# Patient Record
Sex: Male | Born: 1975 | Race: White | Hispanic: No | Marital: Married | Smoking: Never smoker
Health system: Southern US, Community
[De-identification: ages and names within clinical notes are randomized; demographics above are authoritative.]

## PROBLEM LIST (undated history)

## (undated) DIAGNOSIS — M25569 Pain in unspecified knee: Secondary | ICD-10-CM

## (undated) HISTORY — DX: Pain in unspecified knee: M25.569

## (undated) HISTORY — PX: TONSILLECTOMY: SUR1361

## (undated) HISTORY — PX: WISDOM TOOTH EXTRACTION: SHX21

---

## 2004-11-21 ENCOUNTER — Ambulatory Visit: Payer: Self-pay | Admitting: Family Medicine

## 2004-12-26 ENCOUNTER — Ambulatory Visit: Payer: Self-pay | Admitting: Family Medicine

## 2005-09-26 ENCOUNTER — Ambulatory Visit: Payer: Self-pay | Admitting: Family Medicine

## 2005-10-29 ENCOUNTER — Ambulatory Visit: Payer: Self-pay | Admitting: Family Medicine

## 2007-04-09 HISTORY — PX: OTHER SURGICAL HISTORY: SHX169

## 2007-04-15 ENCOUNTER — Ambulatory Visit: Payer: Self-pay | Admitting: Family Medicine

## 2007-04-29 ENCOUNTER — Ambulatory Visit: Payer: Self-pay | Admitting: Family Medicine

## 2007-05-05 ENCOUNTER — Ambulatory Visit: Payer: Self-pay | Admitting: Family Medicine

## 2007-05-06 ENCOUNTER — Encounter (INDEPENDENT_AMBULATORY_CARE_PROVIDER_SITE_OTHER): Payer: Self-pay | Admitting: Internal Medicine

## 2007-05-13 LAB — CONVERTED CEMR LAB
ALT: 25 units/L (ref 0–53)
AST: 17 units/L (ref 0–37)
Alkaline Phosphatase: 63 units/L (ref 39–117)
BUN: 16 mg/dL (ref 6–23)
Basophils Relative: 1 % (ref 0–1)
Bilirubin, Direct: 0.2 mg/dL (ref 0.0–0.3)
Calcium: 9.3 mg/dL (ref 8.4–10.5)
Cholesterol: 175 mg/dL (ref 0–200)
Eosinophils Absolute: 0.1 10*3/uL (ref 0.0–0.7)
Eosinophils Relative: 2 % (ref 0–5)
Glucose, Bld: 94 mg/dL (ref 70–99)
HCT: 47.9 % (ref 39.0–52.0)
Indirect Bilirubin: 0.6 mg/dL (ref 0.0–0.9)
Lymphs Abs: 2.1 10*3/uL (ref 0.7–4.0)
MCHC: 33.2 g/dL (ref 30.0–36.0)
MCV: 92.5 fL (ref 78.0–100.0)
Monocytes Relative: 12 % (ref 3–12)
Neutrophils Relative %: 45 % (ref 43–77)
Platelets: 183 10*3/uL (ref 150–400)
Potassium: 4.9 meq/L (ref 3.5–5.3)
Sodium: 143 meq/L (ref 135–145)
Total Protein: 7.2 g/dL (ref 6.0–8.3)
Triglycerides: 157 mg/dL — ABNORMAL HIGH (ref <150)
VLDL: 31 mg/dL (ref 0–40)

## 2008-02-11 ENCOUNTER — Ambulatory Visit: Payer: Self-pay | Admitting: Family Medicine

## 2008-02-11 DIAGNOSIS — R51 Headache: Secondary | ICD-10-CM | POA: Insufficient documentation

## 2008-02-11 DIAGNOSIS — R519 Headache, unspecified: Secondary | ICD-10-CM | POA: Insufficient documentation

## 2008-12-08 ENCOUNTER — Ambulatory Visit: Payer: Self-pay | Admitting: Family Medicine

## 2008-12-08 DIAGNOSIS — J069 Acute upper respiratory infection, unspecified: Secondary | ICD-10-CM | POA: Insufficient documentation

## 2009-09-05 ENCOUNTER — Ambulatory Visit: Payer: Self-pay | Admitting: Family Medicine

## 2009-09-05 DIAGNOSIS — M25569 Pain in unspecified knee: Secondary | ICD-10-CM | POA: Insufficient documentation

## 2009-09-20 ENCOUNTER — Ambulatory Visit: Payer: Self-pay | Admitting: Family Medicine

## 2010-03-28 NOTE — Assessment & Plan Note (Signed)
Summary: knee pain, acute only/alc   Vital Signs:  Patient profile:   35 year old male Height:      69.75 inches Weight:      201.50 pounds BMI:     29.23 Temp:     98.2 degrees F oral Pulse rate:   76 / minute Pulse rhythm:   regular BP sitting:   122 / 80  (left arm) Cuff size:   regular  Vitals Entered By: Lewanda Rife LPN (September 05, 2009 4:03 PM) CC: Pain in left knee. Now pain scale is 3-4. (Pt has been playing basketball on Mon nights)   History of Present Illness: has been playing BB with some friends once per week  one day after he came home it swelled up like a grapefuit -- even without trauma  went back this week - and now doing the same thing   L knee  pain is dull and widespread ache - no specific area  hurts to bend it all the way -- is tight with swelling also  can straighten it  no problem with lateral movement  some pain after inactivity  stairs generally do not bother it   1/2 marathon coming up in october is a regular runner  running does not bother him at all   also works with a Psychologist, educational at a gym- and no exercises hurt     Allergies (verified): No Known Drug Allergies  Past History:  Past Surgical History: Last updated: 04/29/2007 Lasik both eyes 04/09/07 Tonsillectomy--age 84 wisdom teeth  Family History: Last updated: 04/29/2007 Father: 66--L&W Mother: 69--thyroid Siblings: 3 br--1 has DM, type 2                        2 L&W  DM- 0 MI- 0 CVA- PGM Prostate Cancer- 0 Breast Cancer- 0 Ovarian Cancer- 0 Uterine Cancer- 0 Colon Cancer- 0 Drug/ ETOH Abuse- M cousin -both Depression- 0 lung ca--Muncle and aunt--smokers Parkinsons--Puncle  Social History: Last updated: 09/05/2009 Marital Status: Married Children: 2--5 and 4mo Occupation: owns Teacher, 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 CV:  Denies chest pain or discomfort and palpitations. Resp:  Denies cough. MS:  Complains of joint pain, joint swelling, and stiffness; denies joint redness, cramps, and muscle weakness. Derm:  Denies lesion(s), poor wound healing, and rash. Neuro:  Denies numbness, tingling, and weakness. Heme:  Denies abnormal bruising and bleeding.  Physical Exam  General:  Well-developed,well-nourished,in no acute distress; alert,appropriate and cooperative throughout examination Head:  normocephalic, atraumatic, and no abnormalities observed.   Neck:  No deformities, masses, or tenderness noted. Lungs:  Normal respiratory effort, chest expands symmetrically. Lungs are clear to auscultation, no crackles or wheezes. Heart:  Normal rate and regular rhythm. S1 and S2 normal without gallop, murmur, click, rub or other extra sounds. Msk:  L knee mild superior soft tissue swelling without evidence of effusion no crepitice no bony or joint line tenderness whatsoever  some pain on full flexion neg mcmurray nl drawer/ lachman and  gait   Pulses:  R and L carotid,radial,femoral,dorsalis pedis and posterior tibial pulses are full and equal bilaterally Extremities:  no pedal edema  Neurologic:  strength normal in all extremities, sensation intact to light touch, gait normal, and DTRs symmetrical and normal.   Skin:  Intact without suspicious lesions or rashes Cervical Nodes:  No lymphadenopathy noted Psych:  normal affect, talkative and pleasant    Impression & Recommendations:  Problem # 1:  KNEE PAIN, LEFT  (ICD-719.46) Assessment New  L knee pain and swelling in athelete exacerbated by basketball but not running or weights  pt is training for 1/2 marathon in the fall  fairly nl exam except for some superficial swelling - nl rom with tenderness to full flex suspect overuse injury for 2 wk recommend relative rest / ice/ elevation (no pivoting/ BB)  mobic 15 mg daily for 2 wk then f/u with Dr Patsy Lager for eval  will use soft knee brace as needed for ambulation The following medications were removed from the medication list:    Aleve 220 Mg Tabs (Naproxen sodium) .Marland Kitchen... As needed His updated medication list for this problem includes:    Tylenol Extra Strength 500 Mg Tabs (Acetaminophen) .Marland Kitchen... As needed    Vicodin 5-500 Mg Tabs (Hydrocodone-acetaminophen) .Marland Kitchen... 1 every 4-6hrs as needed pain/headache by mouth    Mobic 15 Mg Tabs (Meloxicam) .Marland Kitchen... 1 by mouth once daily for 14 days - take on a full stomach  Orders: Prescription Created Electronically [phone])  Complete Medication List: 1)  Tylenol Extra Strength 500 Mg Tabs (Acetaminophen) .... As needed 2)  Vicodin 5-500 Mg Tabs (Hydrocodone-acetaminophen) .Marland Kitchen.. 1 every 4-6hrs as needed pain/headache by mouth 3)  Nasonex 50 Mcg/act Susp (Mometasone furoate) .... 2 sprays each nostril once daily for congestion 4)  Delsym 30 Mg/65ml Lqcr (Dextromethorphan polistirex) .... Otc as directed. 5)  Hycodan  .Marland Kitchen.. 1 tsp at bedtime, may repeat in 4-6 hrs as needed cough 6)  Mobic 15 Mg Tabs (Meloxicam) .Marland Kitchen.. 1 by mouth once daily for 14 days - take on a full stomach  Patient Instructions: 1)  elevate your leg and knee  2)  take the mobic with food for 2 weeks 3)  please schedule f/u with Dr Patsy Lager (knee pain in athlete) in about 2 weeks  4)  wear the soft knee brace for running or walking  5)  avoid basketball for 2 weeks until your sports med appt  Prescriptions: MOBIC 15 MG TABS (MELOXICAM) 1 by mouth once daily for 14 days - take on a full stomach  #30  x 0   Entered and Authorized by:   Judith Part MD   Signed by:   Judith Part MD on 09/05/2009   Method used:   Electronically to        [id number] (retail)       [address]       [place], [place]  [address]       Ph: [phone]       Fax: [phone]   RxID:   [id number]   Current Allergies (reviewed today): No known allergies

## 2010-03-28 NOTE — Miscellaneous (Signed)
Summary: Controlled Substances Contract  Controlled Substances Contract   Imported By: Lester Hingham 09/07/2009 11:59:25  _____________________________________________________________________  External Attachment:    Type:   Image     Comment:   External Document

## 2010-03-28 NOTE — Assessment & Plan Note (Signed)
Summary: 30 MIN APPT FOR REFER FRM DR. TOWER FOR KNEE PAIN / LFW   Vital Signs:  Patient profile:   35 year old male Height:      69.75 inches Weight:      200.6 pounds BMI:     29.09 Temp:     98.6 degrees F oral Pulse rate:   76 / minute Pulse rhythm:   regular BP sitting:   90 / 68  (left arm) Cuff size:   regular  Vitals Entered By: Benny Lennert CMA (AAMA) (September 20, 2009 11:00 AM)  History of Present Illness: Chief complaint Left knee pain referral from Dr. Milinda Antis  35 year old with left knee pain:  Has been playing some basketball  Will swell up like a grapefruit. Took three or four weeks off and it blew off -- feeling a lot better now  Beginning training for a marathon right now, up to about 5 miles a day. No mechanical symptom no locking up of his joint no effusion  Mainly hurts when playing basketball.  Has a patellar stability brace.    Allergies (verified): No Known Drug Allergies  Past History:  Past medical, surgical, family and social histories (including risk factors) reviewed, and no changes noted (except as noted below).  Past Medical History: Reviewed history from 09/05/2009 and no changes required. knee pain  Past Surgical History: Reviewed history from 04/29/2007 and no changes required. Lasik both eyes 04/09/07 Tonsillectomy--age 75 wisdom teeth  Family History: Reviewed history from 04/29/2007 and no changes required. Father: 66--L&W Mother: 69--thyroid Siblings: 3 br--1 has DM, type 2                        2 L&W  DM- 0 MI- 0 CVA- PGM Prostate Cancer- 0 Breast Cancer- 0 Ovarian Cancer- 0 Uterine Cancer- 0 Colon Cancer- 0 Drug/ ETOH Abuse- M cousin -both Depression- 0 lung ca--Muncle and aunt--smokers Parkinsons--Puncle  Social History: Reviewed history from 09/05/2009 and no changes required. 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 Otherwise the pertinent positives of the ROS are noted above.    Physical Exam  General:  GEN: Well-developed,well-nourished,in no acute distress; alert,appropriate and cooperative throughout examination HEENT: Normocephalic and atraumatic without obvious abnormalities. No apparent alopecia or balding. Ears, externally no deformities PULM: Breathing comfortably in no respiratory distress EXT: No clubbing, cyanosis, or edema PSYCH: Normally interactive. Cooperative during the interview. Pleasant. Friendly and conversant. Not anxious or depressed appearing. Normal, full affect.  Msk:  Gait: Normal heel toe pattern ROM: WNL Effusion: neg Echymosis or edema: none Patellar tendon NT Painful PLICA: neg Patellar grind: negative Medial and lateral patellar facet loading: negative medial and lateral joint lines:NT Mcmurray's neg Flexion-pinch neg Varus and valgus stress: stable Lachman: neg Ant and Post drawer: neg Hip abduction, IR, ER: WNL Hip flexion str: 5/5 Hip abd: 5/5 Quad: 5/5 VMO atrophy:No Hamstring concentric and eccentric: 5/5    Impression & Recommendations:  Problem # 1:  KNEE PAIN, LEFT (ICD-719.46) The patient's knee seems to have calmed down and I cannot elicit anything that would make me suspicious for internal derangement.  Certainly he could have contused his meniscus while playing basketball, but I am going to have him wear his patellar stability brace when playing and work on some proprioception.  Already doing cross fit.  cc: Dr. Milinda Antis  The following medications were removed from the medication list:    Tylenol Extra Strength 500 Mg Tabs (Acetaminophen) .Marland Kitchen... As needed     Vicodin 5-500 Mg Tabs (Hydrocodone-acetaminophen) .Marland Kitchen... 1 every 4-6hrs as needed pain/headache by mouth    Mobic 15 Mg Tabs (Meloxicam) .Marland Kitchen... 1 by mouth once daily for 14 days - take on a full stomach  Current Allergies (reviewed today): No known allergies

## 2011-08-01 ENCOUNTER — Other Ambulatory Visit: Payer: Self-pay | Admitting: Family Medicine

## 2011-08-01 DIAGNOSIS — Z0000 Encounter for general adult medical examination without abnormal findings: Secondary | ICD-10-CM

## 2011-08-01 DIAGNOSIS — Z136 Encounter for screening for cardiovascular disorders: Secondary | ICD-10-CM

## 2011-08-02 ENCOUNTER — Other Ambulatory Visit (INDEPENDENT_AMBULATORY_CARE_PROVIDER_SITE_OTHER): Payer: BC Managed Care – PPO

## 2011-08-02 DIAGNOSIS — Z136 Encounter for screening for cardiovascular disorders: Secondary | ICD-10-CM

## 2011-08-02 DIAGNOSIS — Z0000 Encounter for general adult medical examination without abnormal findings: Secondary | ICD-10-CM

## 2011-08-02 LAB — LIPID PANEL
Cholesterol: 177 mg/dL (ref 0–200)
HDL: 38.3 mg/dL — ABNORMAL LOW (ref >39.00)
LDL Cholesterol: 113 mg/dL — ABNORMAL HIGH (ref 0–99)
Triglycerides: 131 mg/dL (ref 0.0–149.0)
VLDL: 26.2 mg/dL (ref 0.0–40.0)

## 2011-08-02 LAB — COMPREHENSIVE METABOLIC PANEL
Alkaline Phosphatase: 58 U/L (ref 39–117)
BUN: 16 mg/dL (ref 6–23)
CO2: 29 mEq/L (ref 19–32)
Creatinine, Ser: 1.2 mg/dL (ref 0.4–1.5)
GFR: 70.36 mL/min (ref >60.00)
Glucose, Bld: 81 mg/dL (ref 70–99)
Total Bilirubin: 0.7 mg/dL (ref 0.3–1.2)
Total Protein: 6.9 g/dL (ref 6.0–8.3)

## 2011-08-06 ENCOUNTER — Encounter: Payer: Self-pay | Admitting: Family Medicine

## 2011-08-08 ENCOUNTER — Encounter: Payer: Self-pay | Admitting: *Deleted

## 2011-08-08 ENCOUNTER — Ambulatory Visit (INDEPENDENT_AMBULATORY_CARE_PROVIDER_SITE_OTHER): Payer: BC Managed Care – PPO | Admitting: Family Medicine

## 2011-08-08 ENCOUNTER — Encounter: Payer: Self-pay | Admitting: Family Medicine

## 2011-08-08 VITALS — BP 106/72 | HR 78 | Temp 98.3°F | Wt 210.0 lb

## 2011-08-08 DIAGNOSIS — Z0000 Encounter for general adult medical examination without abnormal findings: Secondary | ICD-10-CM

## 2011-08-08 DIAGNOSIS — R5381 Other malaise: Secondary | ICD-10-CM

## 2011-08-08 DIAGNOSIS — Z23 Encounter for immunization: Secondary | ICD-10-CM

## 2011-08-08 DIAGNOSIS — R5383 Other fatigue: Secondary | ICD-10-CM

## 2011-08-08 NOTE — Progress Notes (Signed)
CPE- See plan.  Routine anticipatory guidance given to patient.  See health maintenance. Colon and prostate cancer screening not indicated.  Tetanus shot due. Flu shot due this fall.   FH thyroid disease.  Mother and grandmother both had thyroid disease.  He is fatigued, sleeping more at night and still is tired the next day.  His weight is up.  He'll also get anxious and have trouble with concentration.  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  [employer] grad, did well in school.   He does drink caffeine. We discussed tapering.  Feeling well o/w.    PMH and SH reviewed  Meds, vitals, and allergies reviewed.   ROS: See HPI.  Otherwise negative.    GEN: nad, alert and oriented HEENT: mucous membranes moist NECK: supple w/o LA, no TMG CV: rrr. PULM: ctab, no inc wob ABD: soft, +bs EXT: no edema SKIN: no acute rash

## 2011-08-08 NOTE — Patient Instructions (Addendum)
I would get a flu shot each fall.   Take care.  Keep exercising, gradually work on cutting out caffeine.  Try to lose weight gradually.  I would chart your calories to get a baseline and then cut 5%.

## 2011-08-09 DIAGNOSIS — Z0000 Encounter for general adult medical examination without abnormal findings: Secondary | ICD-10-CM | POA: Insufficient documentation

## 2011-08-09 DIAGNOSIS — R5383 Other fatigue: Secondary | ICD-10-CM | POA: Insufficient documentation

## 2011-08-09 NOTE — Assessment & Plan Note (Signed)
Routine anticipatory guidance given to patient.  See health maintenance. Colon and prostate cancer screening not indicated.  Tetanus shot done.   Flu shot due this fall.

## 2011-08-09 NOTE — Assessment & Plan Note (Signed)
Check TSH.  Likely exacerbated by caffeine, sleep disruption work load.  Would work on diet and exercise, taper caffeine and see if sx continue.  He agrees.

## 2016-03-02 DIAGNOSIS — Z23 Encounter for immunization: Secondary | ICD-10-CM | POA: Diagnosis not present

## 2016-08-30 DIAGNOSIS — H6121 Impacted cerumen, right ear: Secondary | ICD-10-CM | POA: Diagnosis not present

## 2016-08-30 DIAGNOSIS — H93291 Other abnormal auditory perceptions, right ear: Secondary | ICD-10-CM | POA: Diagnosis not present

## 2016-12-02 ENCOUNTER — Encounter: Payer: Self-pay | Admitting: Family Medicine

## 2016-12-02 ENCOUNTER — Ambulatory Visit (INDEPENDENT_AMBULATORY_CARE_PROVIDER_SITE_OTHER): Payer: BLUE CROSS/BLUE SHIELD | Admitting: Family Medicine

## 2016-12-02 VITALS — BP 116/70 | HR 75 | Temp 97.4°F | Ht 70.5 in | Wt 194.0 lb

## 2016-12-02 DIAGNOSIS — Z23 Encounter for immunization: Secondary | ICD-10-CM | POA: Diagnosis not present

## 2016-12-02 DIAGNOSIS — Z6827 Body mass index (BMI) 27.0-27.9, adult: Secondary | ICD-10-CM | POA: Diagnosis not present

## 2016-12-02 DIAGNOSIS — R0789 Other chest pain: Secondary | ICD-10-CM

## 2016-12-02 DIAGNOSIS — Z0000 Encounter for general adult medical examination without abnormal findings: Secondary | ICD-10-CM

## 2016-12-02 DIAGNOSIS — Z7689 Persons encountering health services in other specified circumstances: Secondary | ICD-10-CM | POA: Diagnosis not present

## 2016-12-02 LAB — COMPREHENSIVE METABOLIC PANEL
ALBUMIN: 4.3 g/dL (ref 3.5–5.2)
ALT: 20 U/L (ref 0–53)
AST: 14 U/L (ref 0–37)
Alkaline Phosphatase: 60 U/L (ref 39–117)
BILIRUBIN TOTAL: 0.7 mg/dL (ref 0.2–1.2)
BUN: 16 mg/dL (ref 6–23)
CALCIUM: 9.6 mg/dL (ref 8.4–10.5)
CO2: 30 mEq/L (ref 19–32)
CREATININE: 1.17 mg/dL (ref 0.40–1.50)
Chloride: 108 mEq/L (ref 96–112)
GFR: 73.12 mL/min (ref >60.00)
Glucose, Bld: 98 mg/dL (ref 70–99)
Potassium: 5.1 mEq/L (ref 3.5–5.1)
Sodium: 143 mEq/L (ref 135–145)
Total Protein: 7.1 g/dL (ref 6.0–8.3)

## 2016-12-02 LAB — CBC
HCT: 50.4 % (ref 39.0–52.0)
Hemoglobin: 17 g/dL (ref 13.0–17.0)
MCHC: 33.7 g/dL (ref 30.0–36.0)
MCV: 96.9 fl (ref 78.0–100.0)
PLATELETS: 206 10*3/uL (ref 150.0–400.0)
RBC: 5.2 Mil/uL (ref 4.22–5.81)
RDW: 13 % (ref 11.5–15.5)
WBC: 6.3 10*3/uL (ref 4.0–10.5)

## 2016-12-02 LAB — LIPID PANEL
CHOL/HDL RATIO: 4
CHOLESTEROL: 181 mg/dL (ref 0–200)
HDL: 46.5 mg/dL (ref >39.00)
LDL Cholesterol: 110 mg/dL — ABNORMAL HIGH (ref 0–99)
NonHDL: 134.61
TRIGLYCERIDES: 124 mg/dL (ref 0.0–149.0)
VLDL: 24.8 mg/dL (ref 0.0–40.0)

## 2016-12-02 NOTE — Patient Instructions (Addendum)
For chest burning, try Mylanta, Mylicon or Tums. See if pattern with foods- avoid triggers    Keeping you healthy  Get these tests  Blood pressure- Have your blood pressure checked once a year by your healthcare provider.  Normal blood pressure is 120/80.  Weight- Have your body mass index (BMI) calculated to screen for obesity.  BMI is a measure of body fat based on height and weight. You can also calculate your own BMI at https://www.west-esparza.com/.  Cholesterol- Have your cholesterol checked regularly starting at age 63, sooner may be necessary if you have diabetes, high blood pressure, if a family member developed heart diseases at an early age or if you smoke.   Chlamydia, HIV, and other sexual transmitted disease- Get screened each year until the age of 70 then within three months of each new sexual partner.  Diabetes- Have your blood sugar checked regularly if you have high blood pressure, high cholesterol, a family history of diabetes or if you are overweight.  Get these vaccines  Flu shot- Every fall.  Tetanus shot- Every 10 years.  Menactra- Single dose; prevents meningitis.  Take these steps  Don't smoke- If you do smoke, ask your healthcare provider about quitting. For tips on how to quit, go to www.smokefree.gov or call 1-800-QUIT-NOW.  Be physically active- Exercise 5 days a week for at least 30 minutes.  If you are not already physically active start slow and gradually work up to 30 minutes of moderate physical activity.  Examples of moderate activity include walking briskly, mowing the yard, dancing, swimming bicycling, etc.  Eat a healthy diet- Eat a variety of healthy foods such as fruits, vegetables, low fat milk, low fat cheese, yogurt, lean meats, poultry, fish, beans, tofu, etc.  For more information on healthy eating, go to www.thenutritionsource.org  Drink alcohol in moderation- Limit alcohol intake two drinks or less a day.  Never drink and drive.  Dentist-  Brush and floss teeth twice daily; visit your dentis twice a year.  Depression-Your emotional health is as important as your physical health.  If you're feeling down, losing interest in things you normally enjoy please talk with your healthcare provider.  Gun Safety- If you keep a gun in your home, keep it unloaded and with the safety lock on.  Bullets should be stored separately.  Helmet use- Always wear a helmet when riding a motorcycle, bicycle, rollerblading or skateboarding.  Safe sex- If you may be exposed to a sexually transmitted infection, use a condom  Seat belts- Seat bels can save your life; always wear one.  Smoke/Carbon Monoxide detectors- These detectors need to be installed on the appropriate level of your home.  Replace batteries at least once a year.  Skin Cancer- When out in the sun, cover up and use sunscreen SPF 15 or higher.  Violence- If anyone is threatening or hurting you, please tell your healthcare provider.

## 2016-12-02 NOTE — Progress Notes (Signed)
Subjective:    Patient ID: Luis Hill, male    DOB: 01/20/1976, 41 y.o.   MRN: [id number]  HPI This is a 41 yo male who presents today to establish care. He is an Advertising account planner. Not too stressful. He is a widow with a 53 yo daughter and son who is 10. Kids doing ok. Gets regular exercise, runs 3-4 times a week. He is dating and things are going well.   His wife died Mar 18, 2022 of cervical cancer. His brother died 2022/10/17 at age 102 of what may have been a massive heart attack. He was overweight, but no known diabetes, HTN, non smoker- did not get regular medical care.  Parents healthy. Live nearby.   Has been eating better and increased exercise, lost 20 pounds in last 8 months. Gets regular dental and eye care.   Past Medical History:  Diagnosis Date  . Knee pain    Past Surgical History:  Procedure Laterality Date  . Lasik both eyes  04/09/07  . TONSILLECTOMY  Age 32  . WISDOM TOOTH EXTRACTION     Family History  Problem Relation Age of Onset  . Thyroid disease Mother   . Arthritis Mother   . Diabetes Brother        Type II  . Heart disease Brother   . Cancer Maternal Aunt        Lung (smoker)  . Cancer Maternal Uncle        Lung (smoker)  . Parkinsonism Paternal Uncle   . Stroke Paternal Grandmother   . Alcohol abuse Cousin   . Drug abuse Cousin   . Depression Neg Hx   . Colon cancer Neg Hx   . Prostate cancer Neg Hx    Social History  Substance Use Topics  . Smoking status: Never Smoker  . Smokeless tobacco: Never Used  . Alcohol use Yes     Comment: occ      Review of Systems  Constitutional: Negative for fatigue, fever and unexpected weight change.  HENT: Negative.   Eyes: Negative.   Respiratory: Negative for chest tightness and shortness of breath.   Cardiovascular: Positive for chest pain (Has intermittent burning, can last for several hours, no radiation up neck/down arms, no nausea/vomiting/diaphoresis, no SOB. Never with running. ). Negative for  palpitations and leg swelling.  Gastrointestinal: Negative.   Genitourinary: Negative.   Musculoskeletal:       Some knee pain with running  Skin: Negative.   Allergic/Immunologic: Negative.   Neurological: Negative.   Psychiatric/Behavioral: Negative for dysphoric mood. The patient is not nervous/anxious.        Objective:   Physical Exam Physical Exam  Constitutional: He is oriented to person, place, and time. He appears well-developed and well-nourished.  HENT:  Head: Normocephalic and atraumatic.  Right Ear: External ear normal.  Left Ear: External ear normal.  Nose: Nose normal.  Mouth/Throat: Oropharynx is clear and moist.  Eyes: Conjunctivae are normal. Pupils are equal, round, and reactive to light.  Neck: Normal range of motion. Neck supple.  Cardiovascular: Normal rate, regular rhythm, normal heart sounds and intact distal pulses.   Pulmonary/Chest: Effort normal and breath sounds normal.  Abdominal: Soft. Bowel sounds are normal. Hernia confirmed negative in the right inguinal area and confirmed negative in the left inguinal area.  Genitourinary: Testes normal and penis normal. Circumcised.  Musculoskeletal: Normal range of motion. He exhibits no edema or tenderness.       Cervical back: Normal.  Thoracic back: Normal.       Lumbar back: Normal.  Lymphadenopathy:    He has no cervical adenopathy.       Right: No inguinal adenopathy present.       Left: No inguinal adenopathy present.  Neurological: He is alert and oriented to person, place, and time. He has normal reflexes.  Skin: Skin is warm and dry.  Psychiatric: He has a normal mood and affect. His behavior is normal. Judgment normal.  Vitals reviewed.   BP 116/70 (BP Location: Right Arm, Patient Position: Sitting, Cuff Size: Normal)   Pulse 75   Temp (!) 97.4 F (36.3 C) (Oral)   Ht 5' 10.5" (1.791 m)   Wt 194 lb (88 kg)   SpO2 97%   BMI 27.44 kg/m  Depression screen [facility] 2/9 12/02/2016    Decreased Interest 0  Down, Depressed, Hopeless 0  PHQ - 2 Score 0   EKG- SR 78     Assessment & Plan:  1. Annual physical exam - Discussed and encouraged healthy lifestyle choices- adequate sleep, regular exercise, stress management and healthy food choices.   2. Other chest pain - quality and pattern consistent with reflux/acid indigestion, no symptoms with running, discussed warning symptoms for angina- ER - EKG 12-Lead - CBC - Comprehensive metabolic panel - Lipid panel  3. Encounter to establish care  4. BMI 27.0-27.9,adult - Lipid panel  5. Flu vaccine need - Flu Vaccine QUAD 36+ mos IM  - follow up in 1 year, sooner if needed  Olean Ree, FNP-BC  [facility], [facility]  12/02/2016 10:04 AM

## 2016-12-04 ENCOUNTER — Telehealth: Payer: Self-pay | Admitting: Family Medicine

## 2016-12-04 NOTE — Telephone Encounter (Signed)
Patient returned Sierra's call. °

## 2016-12-18 ENCOUNTER — Telehealth: Payer: Self-pay

## 2016-12-18 NOTE — Telephone Encounter (Signed)
Copied from CRM #[id number]. Topic: Quick Communication - Office Called Patient >> Dec 18, 2016  2:35 PM Anice PaganiniMunoz, Rayyan Burley I, [place] wrote: Reason for CRM: pt is return call from office

## 2016-12-18 NOTE — Telephone Encounter (Signed)
Called and spoke with pt, pt aware of results.

## 2016-12-18 NOTE — Telephone Encounter (Signed)
Left message for pt to return call to office. Regarding lab results.

## 2017-07-11 ENCOUNTER — Ambulatory Visit: Payer: BLUE CROSS/BLUE SHIELD | Admitting: Family Medicine

## 2017-07-11 ENCOUNTER — Encounter: Payer: Self-pay | Admitting: Family Medicine

## 2017-07-11 VITALS — BP 126/69 | HR 63 | Temp 98.0°F | Ht 70.5 in | Wt 198.0 lb

## 2017-07-11 DIAGNOSIS — M7711 Lateral epicondylitis, right elbow: Secondary | ICD-10-CM | POA: Diagnosis not present

## 2017-07-11 MED ORDER — MELOXICAM 15 MG PO TABS: ORAL_TABLET | ORAL | 0 refills | Status: DC

## 2017-07-11 NOTE — Progress Notes (Signed)
Bow pain

## 2017-07-11 NOTE — Progress Notes (Signed)
   Subjective:    Patient ID: Luis Hill, male    DOB: September 07, 1975, 42 y.o.   MRN: [id number]  HPI This is a 42 yo male who presents today with right elbow pain x 2 weeks. Started after shoveling mulch. Has taken 2-3 ibuprofen with little relief. Sometimes throbbing pain and shooting pain. Tried an over the counter strap without relief.    Past Medical History:  Diagnosis Date  . Knee pain    Past Surgical History:  Procedure Laterality Date  . Lasik both eyes  04/09/07  . TONSILLECTOMY  Age 57  . WISDOM TOOTH EXTRACTION     Family History  Problem Relation Age of Onset  . Thyroid disease Mother   . Arthritis Mother   . Diabetes Brother        Type II  . Heart disease Brother   . Cancer Maternal Aunt        Lung (smoker)  . Cancer Maternal Uncle        Lung (smoker)  . Parkinsonism Paternal Uncle   . Stroke Paternal Grandmother   . Alcohol abuse Cousin   . Drug abuse Cousin   . Depression Neg Hx   . Colon cancer Neg Hx   . Prostate cancer Neg Hx    Social History   Tobacco Use  . Smoking status: Never Smoker  . Smokeless tobacco: Never Used  Substance Use Topics  . Alcohol use: Yes    Comment: occ  . Drug use: No      Review of Systems Per HPI    Objective:   Physical Exam  Constitutional: He is oriented to person, place, and time. He appears well-developed and well-nourished.  HENT:  Head: Normocephalic and atraumatic.  Neck: Normal range of motion. Neck supple.  Cardiovascular: Normal rate.  Pulmonary/Chest: Effort normal.  Musculoskeletal:       Right elbow: He exhibits normal range of motion, no swelling, no effusion and no deformity. Tenderness found. Lateral epicondyle tenderness noted.  Neurological: He is alert and oriented to person, place, and time.  Skin: Skin is warm and dry.  Psychiatric: He has a normal mood and affect. His behavior is normal. Judgment and thought content normal.  Vitals reviewed.        BP 126/69 (BP Location:  Right Arm, Patient Position: Sitting, Cuff Size: Normal)   Pulse 63   Temp 98 F (36.7 C) (Oral)   Ht 5' 10.5" (1.791 m)   Wt 198 lb (89.8 kg)   SpO2 97%   BMI 28.01 kg/m  Wt Readings from Last 3 Encounters:  07/11/17 198 lb (89.8 kg)  12/02/16 194 lb (88 kg)  08/08/11 210 lb (95.3 kg)    Assessment & Plan:  1. Lateral epicondylitis of right elbow - Provided written and verbal information regarding diagnosis and treatment. - suggested slip on sleeve type brace instead of strap - ice/heat for comfort - meloxicam (MOBIC) 15 MG tablet; Take daily for 7-10 days then daily as needed  Dispense: 30 tablet; Refill: 0 - if no improvement, follow up with Dr. Patsy Lager (sports med)  Olean Ree, FNP-BC  [facility], [facility]  07/15/2017 8:15 PM

## 2017-07-11 NOTE — Patient Instructions (Signed)
Try slip on brace, heat/ice as needed   Tennis Elbow Tennis elbow (lateral epicondylitis) is inflammation of the outer tendons of your forearm close to your elbow. Your tendons attach your muscles to your bones. The outer tendons of your forearm are used to extend your wrist, and they attach on the outside part of your elbow. Tennis elbow is often found in people who play tennis, but anyone may get the condition from repeatedly extending the wrist or turning the forearm. What are the causes? This condition is caused by repeatedly extending your wrist and using your hands. It can result from sports or work that requires repetitive forearm movements. Tennis elbow may also be caused by an injury. What increases the risk? You have a higher risk of developing tennis elbow if you play tennis or another racquet sport. You also have a higher risk if you frequently use your hands for work. This condition is also more likely to develop in:  Musicians.  Carpenters, painters, and plumbers.  Cooks.  Cashiers.  People who work in [employer].  Holiday representative workers.  Butchers.  People who use computers.  What are the signs or symptoms? Symptoms of this condition include:  Pain and tenderness in your forearm and the outer part of your elbow. You may only feel the pain when you use your arm, or you may feel it even when you are not using your arm.  A burning feeling that runs from your elbow through your arm.  Weak grip in your hands.  How is this diagnosed? This condition may be diagnosed by medical history and physical exam. You may also have other tests, including:  X-rays.  MRI.  How is this treated? Your health care provider will recommend lifestyle adjustments, such as resting and icing your arm. Treatment may also include:  Medicines for inflammation. This may include shots of cortisone if your pain continues.  Physical therapy. This may include massage or exercises.  An elbow  brace.  Surgery may eventually be recommended if your pain does not go away with treatment. Follow these instructions at home: Activity  Rest your elbow and wrist as directed by your health care provider. Try to avoid any activities that caused the problem until your health care provider says that you can do them again.  If a physical therapist teaches you exercises, do all of them as directed.  If you lift an object, lift it with your palm facing upward. This lowers the stress on your elbow. Lifestyle  If your tennis elbow is caused by sports, check your equipment and make sure that: ? You are using it correctly. ? It is the best fit for you.  If your tennis elbow is caused by work, take breaks frequently, if you are able. Talk with your manager about how to best perform tasks in a way that is safe. ? If your tennis elbow is caused by computer use, talk with your manager about any changes that can be made to your work environment. General instructions  If directed, apply ice to the painful area: ? Put ice in a plastic bag. ? Place a towel between your skin and the bag. ? Leave the ice on for 20 minutes, 2-3 times per day.  Take medicines only as directed by your health care provider.  If you were given a brace, wear it as directed by your health care provider.  Keep all follow-up visits as directed by your health care provider. This is important. Contact a  health care provider if:  Your pain does not get better with treatment.  Your pain gets worse.  You have numbness or weakness in your forearm, hand, or fingers. This information is not intended to replace advice given to you by your health care provider. Make sure you discuss any questions you have with your health care provider. Document Released: 02/11/2005 Document Revised: 10/12/2015 Document Reviewed: 02/07/2014 Elsevier Interactive Patient Education  [employer].

## 2017-08-08 ENCOUNTER — Other Ambulatory Visit: Payer: Self-pay | Admitting: Family Medicine

## 2017-08-08 ENCOUNTER — Other Ambulatory Visit: Payer: Self-pay

## 2017-08-08 DIAGNOSIS — M7711 Lateral epicondylitis, right elbow: Secondary | ICD-10-CM

## 2017-08-13 ENCOUNTER — Ambulatory Visit (INDEPENDENT_AMBULATORY_CARE_PROVIDER_SITE_OTHER)
Admission: RE | Admit: 2017-08-13 | Discharge: 2017-08-13 | Disposition: A | Discharge status: Home / Self Care | Payer: BLUE CROSS/BLUE SHIELD | Source: Ambulatory Visit | Attending: Family Medicine | Admitting: Family Medicine

## 2017-08-13 ENCOUNTER — Ambulatory Visit: Payer: BLUE CROSS/BLUE SHIELD | Admitting: Family Medicine

## 2017-08-13 ENCOUNTER — Encounter: Payer: Self-pay | Admitting: Family Medicine

## 2017-08-13 VITALS — BP 108/70 | HR 89 | Temp 98.4°F | Ht 70.5 in | Wt 198.5 lb

## 2017-08-13 DIAGNOSIS — M25521 Pain in right elbow: Secondary | ICD-10-CM | POA: Diagnosis not present

## 2017-08-13 NOTE — Patient Instructions (Signed)
I'll notify you of xray results and we'll discuss treatment options

## 2017-08-13 NOTE — Progress Notes (Signed)
   Subjective:    Patient ID: Luis Hill, male    DOB: Jul 19, 1975, 42 y.o.   MRN: [id number]  HPI This is a 42 yo male who presents today with continued right elbow pain. Was seen one month ago and given meloxicam and has wearing brace without relief. Took meloxicam daily for several weeks without improvement. Continued pain on side of elbow, worse with twisting hand/arm and squeezing.   Past Medical History:  Diagnosis Date  . Knee pain    Past Surgical History:  Procedure Laterality Date  . Lasik both eyes  04/09/07  . TONSILLECTOMY  Age 43  . WISDOM TOOTH EXTRACTION     Family History  Problem Relation Age of Onset  . Thyroid disease Mother   . Arthritis Mother   . Diabetes Brother        Type II  . Heart disease Brother   . Cancer Maternal Aunt        Lung (smoker)  . Cancer Maternal Uncle        Lung (smoker)  . Parkinsonism Paternal Uncle   . Stroke Paternal Grandmother   . Alcohol abuse Cousin   . Drug abuse Cousin   . Depression Neg Hx   . Colon cancer Neg Hx   . Prostate cancer Neg Hx    Social History   Tobacco Use  . Smoking status: Never Smoker  . Smokeless tobacco: Never Used  Substance Use Topics  . Alcohol use: Yes    Comment: occ  . Drug use: No      Review of Systems Per HPI    Objective:   Physical Exam  Constitutional: He is oriented to person, place, and time. He appears well-developed and well-nourished. No distress.  HENT:  Head: Normocephalic and atraumatic.  Eyes: Conjunctivae are normal.  Cardiovascular: Normal rate.  Pulmonary/Chest: Effort normal.  Musculoskeletal: Normal range of motion.       Right elbow: He exhibits normal range of motion, no swelling, no effusion and no deformity. Tenderness found. Lateral epicondyle tenderness noted.  Neurological: He is alert and oriented to person, place, and time.  Skin: Skin is warm and dry. He is not diaphoretic.  Psychiatric: He has a normal mood and affect. His behavior is normal.  Judgment and thought content normal.  Vitals reviewed.     BP 108/70 (BP Location: Left Arm, Patient Position: Sitting, Cuff Size: Normal)   Pulse 89   Temp 98.4 F (36.9 C) (Oral)   Ht 5' 10.5" (1.791 m)   Wt 198 lb 8 oz (90 kg)   SpO2 97%   BMI 28.08 kg/m  Wt Readings from Last 3 Encounters:  08/13/17 198 lb 8 oz (90 kg)  07/11/17 198 lb (89.8 kg)  12/02/16 194 lb (88 kg)       Assessment & Plan:  1. Right elbow pain - continued pain with conservative treatment, will check xray - DG Elbow Complete Right; Future   Olean Reeeborah Viyan Rosamond, FNP-BC  [facility]  08/13/2017 11:53 AM

## 2018-10-21 IMAGING — DX DG ELBOW COMPLETE 3+V*R*
4 series · 4 of 4 positions shown · non-contrast
Comparison: None

CLINICAL DATA: RIGHT elbow pain for 1 month

EXAM:
RIGHT ELBOW - COMPLETE 3+ VIEW

[elbow ap]
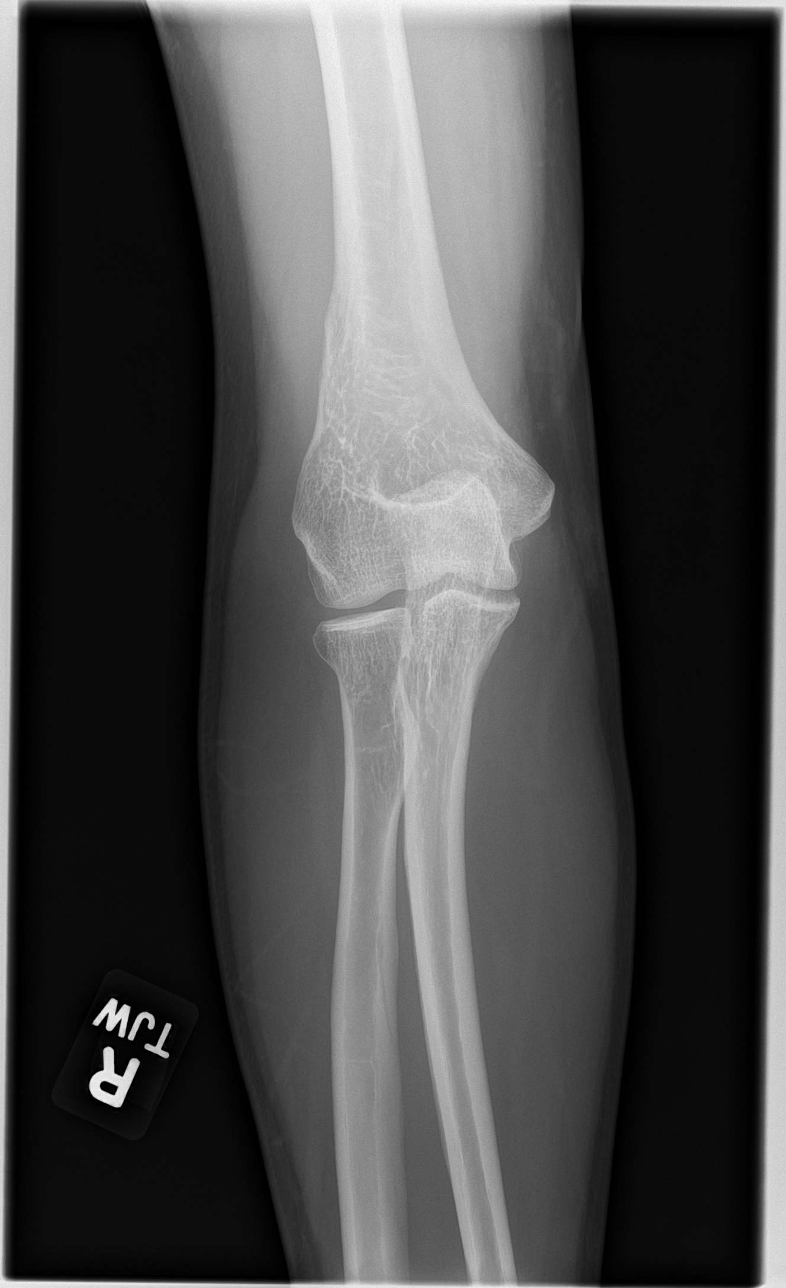

[elbow obl (1 of 2)]
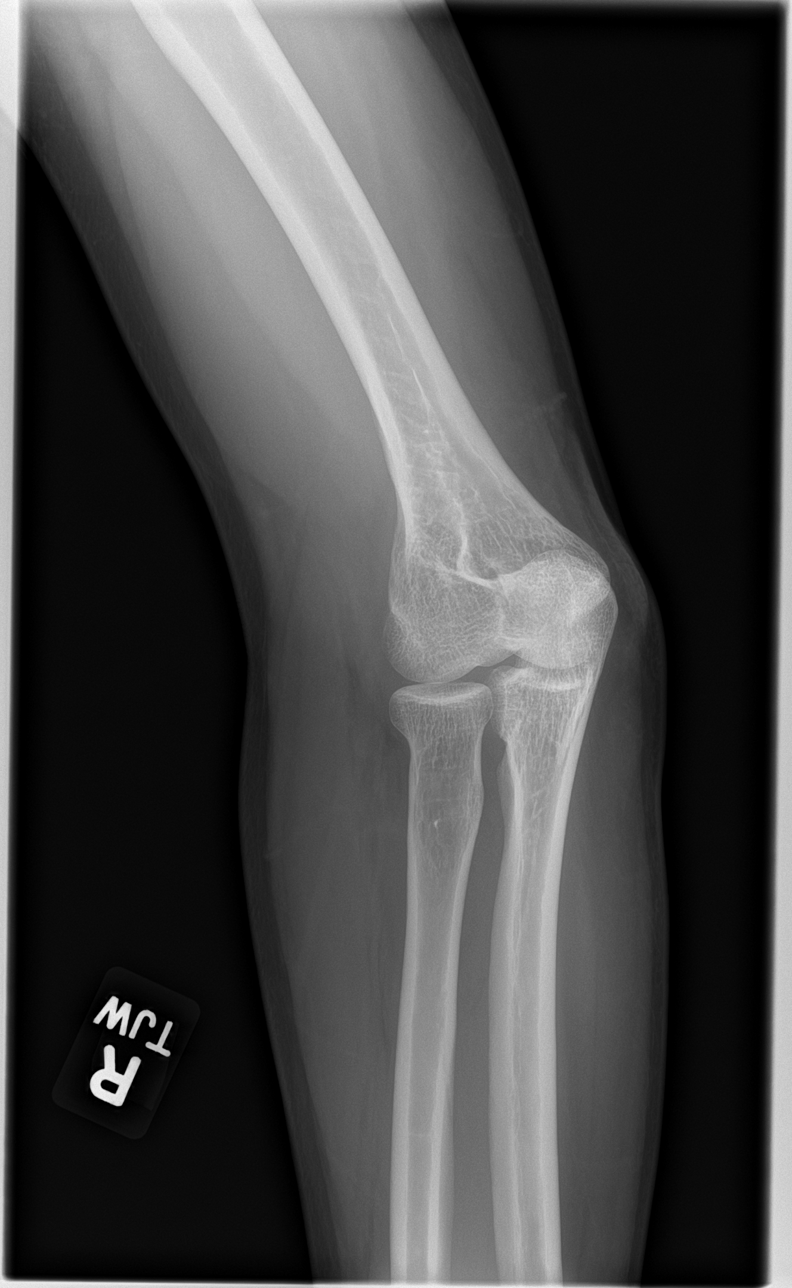

[elbow lat]
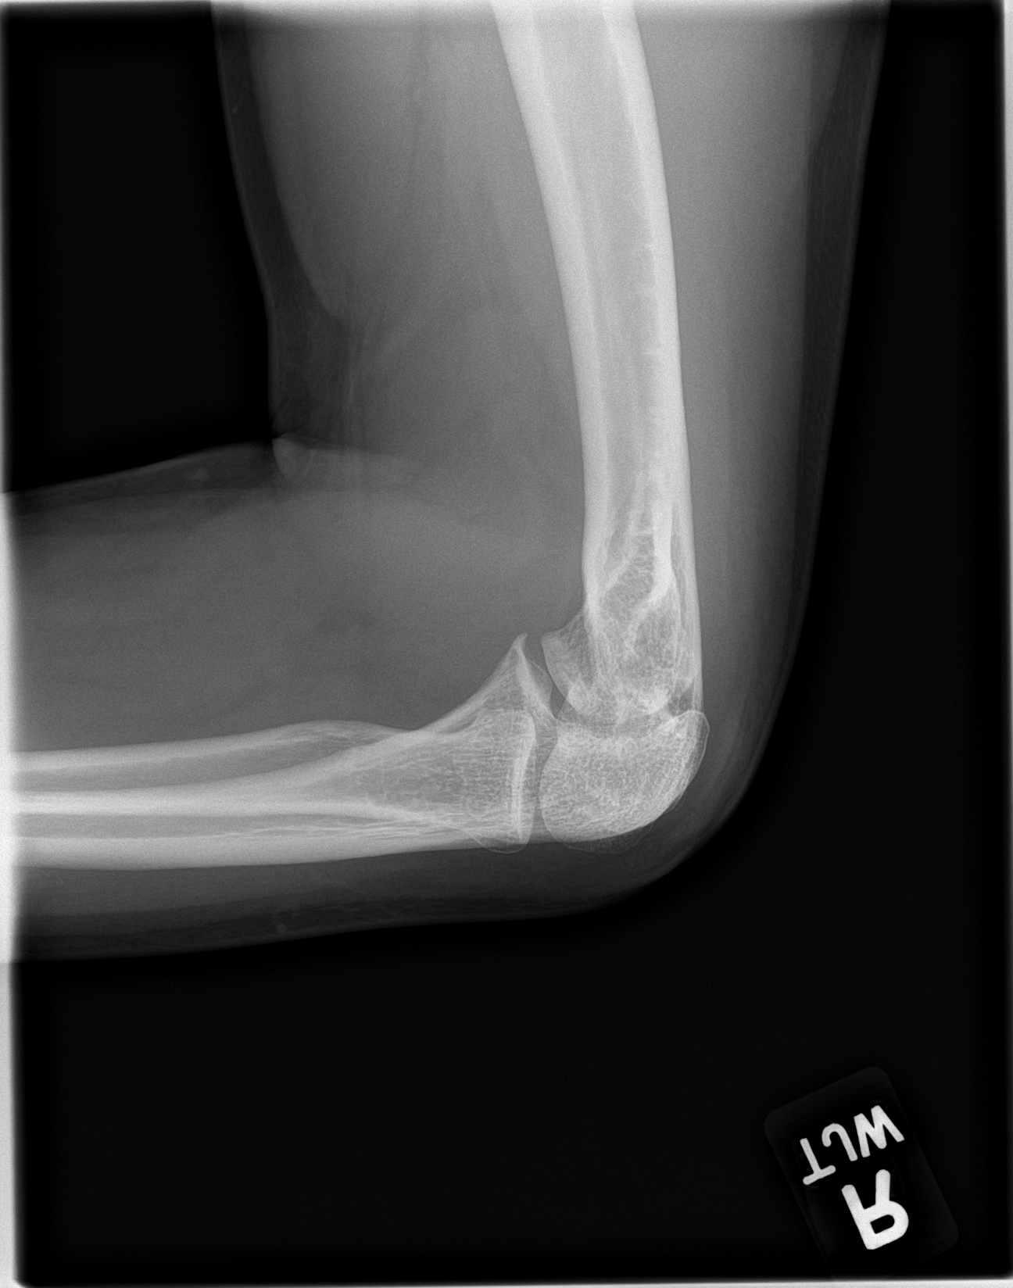

[elbow obl (2 of 2)]
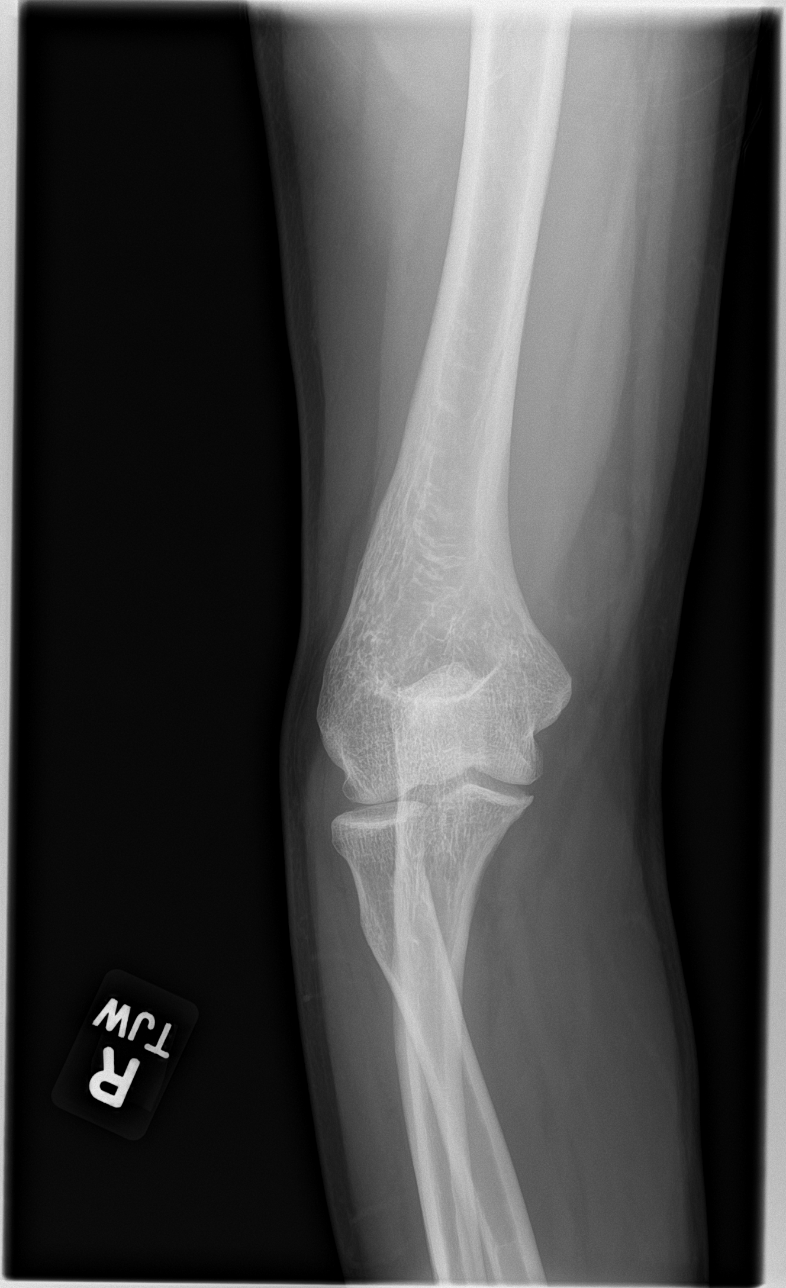

[4 of 4 positions shown; findings below may reference images not displayed]

FINDINGS: Osseous mineralization normal.

Joint spaces preserved.

No fracture, dislocation, or bone destruction.

No joint effusion.
IMPRESSION: Normal exam.

## 2018-11-26 DIAGNOSIS — H52223 Regular astigmatism, bilateral: Secondary | ICD-10-CM | POA: Diagnosis not present

## 2018-12-23 ENCOUNTER — Other Ambulatory Visit (INDEPENDENT_AMBULATORY_CARE_PROVIDER_SITE_OTHER): Payer: BC Managed Care – PPO

## 2018-12-23 ENCOUNTER — Other Ambulatory Visit: Payer: Self-pay

## 2018-12-23 ENCOUNTER — Other Ambulatory Visit: Payer: Self-pay | Admitting: Family Medicine

## 2018-12-23 DIAGNOSIS — E663 Overweight: Secondary | ICD-10-CM

## 2018-12-23 LAB — BASIC METABOLIC PANEL
BUN: 20 mg/dL (ref 6–23)
CO2: 30 mEq/L (ref 19–32)
Calcium: 9.3 mg/dL (ref 8.4–10.5)
Chloride: 105 mEq/L (ref 96–112)
Creatinine, Ser: 1.13 mg/dL (ref 0.40–1.50)
GFR: 70.91 mL/min (ref >60.00)
Glucose, Bld: 87 mg/dL (ref 70–99)
Potassium: 5.1 mEq/L (ref 3.5–5.1)
Sodium: 142 mEq/L (ref 135–145)

## 2018-12-23 LAB — LIPID PANEL
Cholesterol: 219 mg/dL — ABNORMAL HIGH (ref 0–200)
HDL: 47.4 mg/dL (ref >39.00)
NonHDL: 171.64
Total CHOL/HDL Ratio: 5
Triglycerides: 207 mg/dL — ABNORMAL HIGH (ref 0.0–149.0)
VLDL: 41.4 mg/dL — ABNORMAL HIGH (ref 0.0–40.0)

## 2018-12-23 LAB — LDL CHOLESTEROL, DIRECT: Direct LDL: 144 mg/dL

## 2018-12-30 ENCOUNTER — Encounter: Payer: Self-pay | Admitting: Family Medicine

## 2018-12-30 ENCOUNTER — Other Ambulatory Visit: Payer: Self-pay

## 2018-12-30 ENCOUNTER — Ambulatory Visit (INDEPENDENT_AMBULATORY_CARE_PROVIDER_SITE_OTHER): Payer: BC Managed Care – PPO | Admitting: Family Medicine

## 2018-12-30 VITALS — BP 108/78 | HR 67 | Temp 98.0°F | Ht 70.25 in | Wt 200.4 lb

## 2018-12-30 DIAGNOSIS — Z0000 Encounter for general adult medical examination without abnormal findings: Secondary | ICD-10-CM

## 2018-12-30 DIAGNOSIS — R1310 Dysphagia, unspecified: Secondary | ICD-10-CM

## 2018-12-30 NOTE — Patient Instructions (Signed)
Good to see you today  YOu will get a call about an appointment with gastroenterology  Follow up in 1 year  Health Maintenance, Male Adopting a healthy lifestyle and getting preventive care are important in promoting health and wellness. Ask your health care provider about:  The right schedule for you to have regular tests and exams.  Things you can do on your own to prevent diseases and keep yourself healthy. What should I know about diet, weight, and exercise? Eat a healthy diet   Eat a diet that includes plenty of vegetables, fruits, low-fat dairy products, and lean protein.  Do not eat a lot of foods that are high in solid fats, added sugars, or sodium. Maintain a healthy weight Body mass index (BMI) is a measurement that can be used to identify possible weight problems. It estimates body fat based on height and weight. Your health care provider can help determine your BMI and help you achieve or maintain a healthy weight. Get regular exercise Get regular exercise. This is one of the most important things you can do for your health. Most adults should:  Exercise for at least 150 minutes each week. The exercise should increase your heart rate and make you sweat (moderate-intensity exercise).  Do strengthening exercises at least twice a week. This is in addition to the moderate-intensity exercise.  Spend less time sitting. Even light physical activity can be beneficial. Watch cholesterol and blood lipids Have your blood tested for lipids and cholesterol at 42 years of age, then have this test every 5 years. You may need to have your cholesterol levels checked more often if:  Your lipid or cholesterol levels are high.  You are older than 42 years of age.  You are at high risk for heart disease. What should I know about cancer screening? Many types of cancers can be detected early and may often be prevented. Depending on your health history and family history, you may need to  have cancer screening at various ages. This may include screening for:  Colorectal cancer.  Prostate cancer.  Skin cancer.  Lung cancer. What should I know about heart disease, diabetes, and high blood pressure? Blood pressure and heart disease  High blood pressure causes heart disease and increases the risk of stroke. This is more likely to develop in people who have high blood pressure readings, are of African descent, or are overweight.  Talk with your health care provider about your target blood pressure readings.  Have your blood pressure checked: ? Every 3-5 years if you are 31-69 years of age. ? Every year if you are 49 years old or older.  If you are between the ages of 2 and 38 and are a current or former smoker, ask your health care provider if you should have a one-time screening for abdominal aortic aneurysm (AAA). Diabetes Have regular diabetes screenings. This checks your fasting blood sugar level. Have the screening done:  Once every three years after age 18 if you are at a normal weight and have a low risk for diabetes.  More often and at a younger age if you are overweight or have a high risk for diabetes. What should I know about preventing infection? Hepatitis B If you have a higher risk for hepatitis B, you should be screened for this virus. Talk with your health care provider to find out if you are at risk for hepatitis B infection. Hepatitis C Blood testing is recommended for:  Everyone born from  1944 through 29.  Anyone with known risk factors for hepatitis C. Sexually transmitted infections (STIs)  You should be screened each year for STIs, including gonorrhea and chlamydia, if: ? You are sexually active and are younger than 42 years of age. ? You are older than 42 years of age and your health care provider tells you that you are at risk for this type of infection. ? Your sexual activity has changed since you were last screened, and you are at  increased risk for chlamydia or gonorrhea. Ask your health care provider if you are at risk.  Ask your health care provider about whether you are at high risk for HIV. Your health care provider may recommend a prescription medicine to help prevent HIV infection. If you choose to take medicine to prevent HIV, you should first get tested for HIV. You should then be tested every 3 months for as long as you are taking the medicine. Follow these instructions at home: Lifestyle  Do not use any products that contain nicotine or tobacco, such as cigarettes, e-cigarettes, and chewing tobacco. If you need help quitting, ask your health care provider.  Do not use street drugs.  Do not share needles.  Ask your health care provider for help if you need support or information about quitting drugs. Alcohol use  Do not drink alcohol if your health care provider tells you not to drink.  If you drink alcohol: ? Limit how much you have to 0-2 drinks a day. ? Be aware of how much alcohol is in your drink. In the [place] one drink equals one 12 oz bottle of beer (355 mL), one 5 oz glass of wine (148 mL), or one 1 oz glass of hard liquor (44 mL). General instructions  Schedule regular health, dental, and eye exams.  Stay current with your vaccines.  Tell your health care provider if: ? You often feel depressed. ? You have ever been abused or do not feel safe at home. Summary  Adopting a healthy lifestyle and getting preventive care are important in promoting health and wellness.  Follow your health care provider's instructions about healthy diet, exercising, and getting tested or screened for diseases.  Follow your health care provider's instructions on monitoring your cholesterol and blood pressure. This information is not intended to replace advice given to you by your health care provider. Make sure you discuss any questions you have with your health care provider. Document Released: 08/10/2007  Document Revised: 02/04/2018 Document Reviewed: 02/04/2018 Elsevier Patient Education  2020 [employer].

## 2018-12-30 NOTE — Progress Notes (Signed)
Subjective:    Patient ID: Luis Hill, male    DOB: 1975/09/02, 43 y.o.   MRN: [id number]  HPI This is a 43 yo male who presents today for routine CPE.   Last CPE- 11/2016 Tdap- 08/08/2011 Flu- today Dental- regular Eye- regular Exercise- runs Diet- has cut out soda, rarely eats out  Difficulty swallowing certain foods. For about 6 months. Worse with chicken and other meats. No pain with swallowing. Occurs about once a month. No burning, acid reflux or chest pain. Food eventually passes.     Past Medical History:  Diagnosis Date  . Knee pain    Past Surgical History:  Procedure Laterality Date  . Lasik both eyes  04/09/07  . TONSILLECTOMY  Age 77  . WISDOM TOOTH EXTRACTION     Family History  Problem Relation Age of Onset  . Thyroid disease Mother   . Arthritis Mother   . Diabetes Brother        Type II  . Heart disease Brother   . Cancer Maternal Aunt        Lung (smoker)  . Cancer Maternal Uncle        Lung (smoker)  . Parkinsonism Paternal Uncle   . Stroke Paternal Grandmother   . Alcohol abuse Cousin   . Drug abuse Cousin   . Depression Neg Hx   . Colon cancer Neg Hx   . Prostate cancer Neg Hx    Social History   Tobacco Use  . Smoking status: Never Smoker  . Smokeless tobacco: Never Used  Substance Use Topics  . Alcohol use: Yes    Comment: occ  . Drug use: No     Review of Systems  Constitutional: Negative.   HENT: Negative.   Eyes: Negative.   Respiratory: Negative.   Cardiovascular: Negative.   Gastrointestinal: Negative for abdominal pain, anal bleeding, blood in stool, constipation, diarrhea, nausea and vomiting.       Difficulty swallowing, see HPI.   Endocrine: Negative.   Genitourinary: Negative.   Musculoskeletal:       Occasional knee pain, can't run as much as he used to.   Skin: Negative.   Allergic/Immunologic: Negative.   Neurological: Negative.   Hematological: Negative.   Psychiatric/Behavioral: Negative.         Objective:   Physical Exam Vitals signs reviewed.  Constitutional:      General: He is not in acute distress.    Appearance: Normal appearance. He is normal weight. He is not ill-appearing, toxic-appearing or diaphoretic.  HENT:     Head: Normocephalic and atraumatic.     Right Ear: Tympanic membrane, ear canal and external ear normal.     Left Ear: Tympanic membrane, ear canal and external ear normal.     Nose: Nose normal.     Mouth/Throat:     Mouth: Mucous membranes are moist.     Pharynx: Oropharynx is clear.  Eyes:     Conjunctiva/sclera: Conjunctivae normal.  Neck:     Musculoskeletal: Normal range of motion and neck supple. No neck rigidity or muscular tenderness.  Cardiovascular:     Rate and Rhythm: Normal rate and regular rhythm.     Heart sounds: Normal heart sounds.  Pulmonary:     Effort: Pulmonary effort is normal.     Breath sounds: Normal breath sounds.  Abdominal:     General: Abdomen is flat. Bowel sounds are normal. There is no distension.     Palpations: Abdomen is  soft. There is no mass.     Tenderness: There is no abdominal tenderness. There is no guarding or rebound.     Hernia: No hernia is present.  Musculoskeletal:     Right lower leg: No edema.     Left lower leg: No edema.  Lymphadenopathy:     Cervical: No cervical adenopathy.  Skin:    General: Skin is warm and dry.  Neurological:     Mental Status: He is alert and oriented to person, place, and time.  Psychiatric:        Mood and Affect: Mood normal.        Behavior: Behavior normal.        Thought Content: Thought content normal.        Judgment: Judgment normal.       BP 108/78 (BP Location: Left Arm, Patient Position: Sitting, Cuff Size: Normal)   Pulse 67   Temp 98 F (36.7 C) (Temporal)   Ht 5' 10.25" (1.784 m)   Wt 200 lb 6.4 oz (90.9 kg)   SpO2 97%   BMI 28.55 kg/m  Wt Readings from Last 3 Encounters:  12/30/18 200 lb 6.4 oz (90.9 kg)  08/13/17 198 lb 8 oz (90 kg)   07/11/17 198 lb (89.8 kg)      Depression screen [facility] 2/9 12/30/2018 12/02/2016  Decreased Interest 0 0  Down, Depressed, Hopeless 0 0  PHQ - 2 Score 0 0    Assessment & Plan:  1. Annual physical exam - reviewed health maintenance, family history and encouraged regular care, monitoring of BP, lipids - reviewed labs and ASCVD risk 1.9% - Discussed and encouraged healthy lifestyle choices- adequate sleep, regular exercise, stress management and healthy food choices.    2. Dysphagia, unspecified type - Ambulatory referral to Gastroenterology   Olean Ree, FNP-BC  [facility], [facility]  12/30/2018 10:06 AM

## 2019-02-01 ENCOUNTER — Other Ambulatory Visit: Payer: Self-pay

## 2019-02-01 ENCOUNTER — Encounter: Payer: Self-pay | Admitting: Gastroenterology

## 2019-02-01 ENCOUNTER — Ambulatory Visit: Payer: BC Managed Care – PPO | Admitting: Gastroenterology

## 2019-02-01 VITALS — BP 128/80 | HR 74 | Temp 98.6°F | Wt 203.2 lb

## 2019-02-01 DIAGNOSIS — R1310 Dysphagia, unspecified: Secondary | ICD-10-CM | POA: Diagnosis not present

## 2019-02-03 NOTE — Progress Notes (Signed)
Luis Hill [address]  [address]  [place], [place] [address]  Main: [phone]  Fax: [phone]   Gastroenterology Consultation  Referring Provider:     Emi Belfast, FNP Primary Care Physician:  Emi Belfast, FNP Reason for Consultation:     Dysphagia        HPI:    Chief Complaint  Patient presents with  . New Patient (Initial Visit)  . Dysphagia    Patient states the last 6 months he feels like food gets stuck in chest off and on. States the last time was thanksgiving     Luis Hill is a 43 y.o. y/o male referred for consultation & management  by Dr. Leone Payor, Binnie Rail, FNP.  Patient reports 2 to 33-month history of intermittent dysphagia, to solid foods.  No dysphagia to liquids.  No prior history of similar symptoms.  No prior EGD.  When it occurs, he stops what he is doing and goes for a walk or tries to relax himself and it eventually passes.  Has not had any episodes where he has had to come to the ER for food impaction.  No weight loss.  No abdominal pain.  No blood in stool.  No family history of colon cancer.  Past Medical History:  Diagnosis Date  . Knee pain     Past Surgical History:  Procedure Laterality Date  . Lasik both eyes  04/09/07  . TONSILLECTOMY  Age 18  . WISDOM TOOTH EXTRACTION      Prior to Admission medications   Not on File    Family History  Problem Relation Age of Onset  . Thyroid disease Mother   . Arthritis Mother   . Diabetes Brother        Type II  . Heart disease Brother   . Cancer Maternal Aunt        Lung (smoker)  . Cancer Maternal Uncle        Lung (smoker)  . Parkinsonism Paternal Uncle   . Stroke Paternal Grandmother   . Alcohol abuse Cousin   . Drug abuse Cousin   . Depression Neg Hx   . Colon cancer Neg Hx   . Prostate cancer Neg Hx      Social History   Tobacco Use  . Smoking status: Never Smoker  . Smokeless tobacco: Never Used  Substance Use Topics  . Alcohol use: Yes     Alcohol/week: 3.0 - 4.0 standard drinks    Types: 3 - 4 Standard drinks or equivalent per week    Comment: occ  . Drug use: No    Allergies as of 02/01/2019  . (No Known Allergies)    Review of Systems:    All systems reviewed and negative except where noted in HPI.   Physical Exam:  BP 128/80 (BP Location: Left Arm, Patient Position: Sitting, Cuff Size: Normal)   Pulse 74   Temp 98.6 F (37 C) (Oral)   Wt 203 lb 4 oz (92.2 kg)   BMI 28.96 kg/m  No LMP for male patient. Psych:  Alert and cooperative. Normal mood and affect. General:   Alert,  Well-developed, well-nourished, pleasant and cooperative in NAD Head:  Normocephalic and atraumatic. Eyes:  Sclera clear, no icterus.   Conjunctiva pink. Ears:  Normal auditory acuity. Nose:  No deformity, discharge, or lesions. Mouth:  No deformity or lesions,oropharynx pink & moist. Neck:  Supple; no masses or thyromegaly. Abdomen:  Normal bowel sounds.  No bruits.  Soft, non-tender and non-distended without masses, hepatosplenomegaly or hernias noted.  No guarding or rebound tenderness.    Msk:  Symmetrical without gross deformities. Good, equal movement & strength bilaterally. Pulses:  Normal pulses noted. Extremities:  No clubbing or edema.  No cyanosis. Neurologic:  Alert and oriented x3;  grossly normal neurologically. Skin:  Intact without significant lesions or rashes. No jaundice. Lymph Nodes:  No significant cervical adenopathy. Psych:  Alert and cooperative. Normal mood and affect.   Labs: CBC    Component Value Date/Time   WBC 6.3 12/02/2016 1027   RBC 5.20 12/02/2016 1027   HGB 17.0 12/02/2016 1027   HCT 50.4 12/02/2016 1027   PLT 206.0 12/02/2016 1027   MCV 96.9 12/02/2016 1027   MCHC 33.7 12/02/2016 1027   RDW 13.0 12/02/2016 1027   LYMPHSABS 2.1 05/06/2007 0032   MONOABS 0.6 05/06/2007 0032   EOSABS 0.1 05/06/2007 0032   BASOSABS 0.0 05/06/2007 0032   CMP     Component Value Date/Time   NA 142  12/23/2018 0831   K 5.1 12/23/2018 0831   CL 105 12/23/2018 0831   CO2 30 12/23/2018 0831   GLUCOSE 87 12/23/2018 0831   BUN 20 12/23/2018 0831   CREATININE 1.13 12/23/2018 0831   CALCIUM 9.3 12/23/2018 0831   PROT 7.1 12/02/2016 1027   ALBUMIN 4.3 12/02/2016 1027   AST 14 12/02/2016 1027   ALT 20 12/02/2016 1027   ALKPHOS 60 12/02/2016 1027   BILITOT 0.7 12/02/2016 1027    Imaging Studies: No results found.  Assessment and Plan:   Luis Hill is a 43 y.o. 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  The patient agrees with this plan & written consent will be obtained.    Patient advised to avoid hard meats or breads until the procedure, chew his food well, small bites and following with liquids to prevent any episodes of food impaction.    Dr Vonda Antigua  Speech recognition software was used to dictate the above note.

## 2019-02-08 ENCOUNTER — Telehealth: Payer: Self-pay

## 2019-02-08 ENCOUNTER — Other Ambulatory Visit: Payer: BC Managed Care – PPO

## 2019-02-08 NOTE — Telephone Encounter (Signed)
Patient called to cancel procedure for 02/11/2019. Patient states work is crazy right now and not get off work. He states he will call back first of the year to rescheduled the procedure. Called ENDO and cancel the procedure. Canceled referral for procedure

## 2019-02-11 ENCOUNTER — Encounter: Admission: RE | Payer: Self-pay | Source: Home / Self Care

## 2019-02-11 ENCOUNTER — Ambulatory Visit
Admission: RE | Admit: 2019-02-11 | Payer: BC Managed Care – PPO | Source: Home / Self Care | Admitting: Gastroenterology

## 2019-02-11 SURGERY — ESOPHAGOGASTRODUODENOSCOPY (EGD) WITH PROPOFOL
Anesthesia: General

## 2020-02-11 ENCOUNTER — Other Ambulatory Visit: Payer: Self-pay

## 2020-02-11 ENCOUNTER — Telehealth: Payer: Self-pay | Admitting: Family Medicine

## 2020-02-11 ENCOUNTER — Other Ambulatory Visit (INDEPENDENT_AMBULATORY_CARE_PROVIDER_SITE_OTHER): Payer: 59

## 2020-02-11 ENCOUNTER — Other Ambulatory Visit: Payer: Self-pay | Admitting: Family Medicine

## 2020-02-11 DIAGNOSIS — E7841 Elevated Lipoprotein(a): Secondary | ICD-10-CM

## 2020-02-11 DIAGNOSIS — E663 Overweight: Secondary | ICD-10-CM | POA: Diagnosis not present

## 2020-02-11 DIAGNOSIS — Z114 Encounter for screening for human immunodeficiency virus [HIV]: Secondary | ICD-10-CM

## 2020-02-11 DIAGNOSIS — Z1159 Encounter for screening for other viral diseases: Secondary | ICD-10-CM

## 2020-02-11 DIAGNOSIS — Z113 Encounter for screening for infections with a predominantly sexual mode of transmission: Secondary | ICD-10-CM

## 2020-02-11 LAB — COMPREHENSIVE METABOLIC PANEL
ALT: 20 U/L (ref 0–53)
AST: 15 U/L (ref 0–37)
Albumin: 4.4 g/dL (ref 3.5–5.2)
Alkaline Phosphatase: 59 U/L (ref 39–117)
BUN: 15 mg/dL (ref 6–23)
CO2: 28 mEq/L (ref 19–32)
Calcium: 9 mg/dL (ref 8.4–10.5)
Chloride: 107 mEq/L (ref 96–112)
Creatinine, Ser: 1.11 mg/dL (ref 0.40–1.50)
GFR: 81.11 mL/min (ref >60.00)
Glucose, Bld: 136 mg/dL — ABNORMAL HIGH (ref 70–99)
Potassium: 4.3 mEq/L (ref 3.5–5.1)
Sodium: 141 mEq/L (ref 135–145)
Total Bilirubin: 0.4 mg/dL (ref 0.2–1.2)
Total Protein: 7 g/dL (ref 6.0–8.3)

## 2020-02-11 LAB — LIPID PANEL
Cholesterol: 182 mg/dL (ref 0–200)
HDL: 50.8 mg/dL (ref >39.00)
LDL Cholesterol: 116 mg/dL — ABNORMAL HIGH (ref 0–99)
NonHDL: 131.57
Total CHOL/HDL Ratio: 4
Triglycerides: 78 mg/dL (ref 0.0–149.0)
VLDL: 15.6 mg/dL (ref 0.0–40.0)

## 2020-02-11 LAB — HEMOGLOBIN A1C: Hgb A1c MFr Bld: 5.4 % (ref 4.6–6.5)

## 2020-02-11 NOTE — Telephone Encounter (Signed)
Pt called in wanted to know about getting a STD screening due to his spouse was caught cheating on him

## 2020-02-11 NOTE — Telephone Encounter (Signed)
Orders placed.

## 2020-02-14 ENCOUNTER — Other Ambulatory Visit (INDEPENDENT_AMBULATORY_CARE_PROVIDER_SITE_OTHER): Payer: 59

## 2020-02-14 ENCOUNTER — Ambulatory Visit (INDEPENDENT_AMBULATORY_CARE_PROVIDER_SITE_OTHER): Payer: 59 | Admitting: Family Medicine

## 2020-02-14 ENCOUNTER — Other Ambulatory Visit: Payer: Self-pay

## 2020-02-14 ENCOUNTER — Encounter: Payer: Self-pay | Admitting: Family Medicine

## 2020-02-14 VITALS — BP 120/82 | HR 82 | Temp 97.8°F | Ht 70.25 in | Wt 196.0 lb

## 2020-02-14 DIAGNOSIS — Z113 Encounter for screening for infections with a predominantly sexual mode of transmission: Secondary | ICD-10-CM

## 2020-02-14 DIAGNOSIS — R1310 Dysphagia, unspecified: Secondary | ICD-10-CM | POA: Diagnosis not present

## 2020-02-14 DIAGNOSIS — Z0000 Encounter for general adult medical examination without abnormal findings: Secondary | ICD-10-CM

## 2020-02-14 DIAGNOSIS — E663 Overweight: Secondary | ICD-10-CM

## 2020-02-14 DIAGNOSIS — Z23 Encounter for immunization: Secondary | ICD-10-CM

## 2020-02-14 LAB — HEPATITIS C ANTIBODY
Hepatitis C Ab: NONREACTIVE
SIGNAL TO CUT-OFF: 0.01 (ref <1.00)

## 2020-02-14 LAB — HIV ANTIBODY (ROUTINE TESTING W REFLEX): HIV 1&2 Ab, 4th Generation: NONREACTIVE

## 2020-02-14 LAB — RPR: RPR Ser Ql: NONREACTIVE

## 2020-02-14 NOTE — Addendum Note (Signed)
Addended by: Aquilla Solian on: 02/14/2020 10:34 AM   Modules accepted: Orders

## 2020-02-14 NOTE — Patient Instructions (Signed)
Good to see you today  Keep up the good work with your diet and exercise  Get your Covid booster

## 2020-02-14 NOTE — Addendum Note (Signed)
Addended by: Aquilla Solian on: 02/14/2020 10:33 AM   Modules accepted: Orders

## 2020-02-14 NOTE — Progress Notes (Signed)
Subjective:    Patient ID: QUAY SIMKIN, male    DOB: 01-Sep-1975, 44 y.o.   MRN: [id number]  HPI Chief Complaint  Patient presents with  . Annual Exam   This is a 44 yo male who presents today for annual exam. Has been doing ok.   Last CPE- 12/30/2018 Tdap- 08/08/2011 Flu- annual Covid- fully vaccinated Dental-every 6 months Eye-overdue, last eye exam 1.5 years ago Exercise- regular  Dysphagia- was referred to GI, never had EGD, symptoms resolved.  Rare GERD.  STD screening-recently learned that his live-in fianc was not monogamous.  He had blood work drawn with his CPE labs last week, was unable to provide urine sample at that time but provided 1 this morning.  He denies any dysuria, penile discharge, testicular pain or swelling.  Review of Systems  Constitutional: Negative.   HENT: Negative.   Eyes: Negative.   Respiratory: Negative.   Cardiovascular: Negative.   Gastrointestinal: Negative.   Endocrine: Negative.   Genitourinary: Negative.   Musculoskeletal: Negative.   Skin: Negative.   Allergic/Immunologic: Negative.   Neurological: Negative.   Hematological: Negative.   Psychiatric/Behavioral: Negative.        Objective:   Physical Exam Physical Exam  Constitutional: He is oriented to person, place, and time. He appears well-developed and well-nourished.  HENT:  Head: Normocephalic and atraumatic.  Right Ear: External ear normal. TM normal.  Left Ear: External ear normal.  TM normal.  Nose: Nose normal.  Mouth/Throat: Oropharynx is clear and moist.  Eyes: Conjunctivae are normal.  Neck: Normal range of motion. Neck supple.  Cardiovascular: Normal rate, regular rhythm, normal heart sounds and intact distal pulses.   Pulmonary/Chest: Effort normal and breath sounds normal.  Abdominal: Soft. Bowel sounds are normal.  Musculoskeletal: Normal range of motion. He exhibits no edema or tenderness.       Cervical back: Normal.       Thoracic back: Normal.        Lumbar back: Normal.  Lymphadenopathy:    He has no cervical adenopathy.       Right: No inguinal adenopathy present.       Left: No inguinal adenopathy present.  Neurological: He is alert and oriented to person, place, and time.  Skin: Skin is warm and dry.  Psychiatric: He has a normal mood and affect. His behavior is normal. Judgment normal.  Vitals reviewed.    BP 120/82   Pulse 82   Temp 97.8 F (36.6 C) (Temporal)   Ht 5' 10.25" (1.784 m)   Wt 196 lb (88.9 kg)   SpO2 99%   BMI 27.92 kg/m  Wt Readings from Last 3 Encounters:  02/14/20 196 lb (88.9 kg)  02/01/19 203 lb 4 oz (92.2 kg)  12/30/18 200 lb 6.4 oz (90.9 kg)        Assessment & Plan:  1. Annual physical exam - Discussed and encouraged healthy lifestyle choices- adequate sleep, regular exercise, stress management and healthy food choices.    2. Need for influenza vaccination - Flu Vaccine QUAD 36+ mos IM  3. Overweight (BMI 25.0-29.9) -Has been working to reduce carbohydrates and exercise on a more consistent basis with some resulting weight loss.  Encouraged his efforts  4. Dysphagia, unspecified type -Patient was referred for GI work-up last year.  He reports that his symptoms have resolved.  Discussed follow-up precautions, symptom return or unexplained weight loss.  This visit occurred during the SARS-CoV-2 public health emergency.  Safety protocols were  in place, including screening questions prior to the visit, additional usage of staff PPE, and extensive cleaning of exam room while observing appropriate contact time as indicated for disinfecting solutions.      Olean Ree, FNP-BC  [facility], [facility]  02/14/2020 10:56 AM

## 2020-02-15 LAB — C. TRACHOMATIS/N. GONORRHOEAE RNA
C. trachomatis RNA, TMA: NOT DETECTED
N. gonorrhoeae RNA, TMA: NOT DETECTED

## 2020-02-16 LAB — TRICHOMONAS VAGINALIS RNA, QL,MALES: Trichomonas vaginalis RNA: NOT DETECTED

## 2020-03-16 ENCOUNTER — Encounter: Payer: Self-pay | Admitting: Internal Medicine

## 2020-03-16 ENCOUNTER — Telehealth (INDEPENDENT_AMBULATORY_CARE_PROVIDER_SITE_OTHER): Payer: 59 | Admitting: Internal Medicine

## 2020-03-16 DIAGNOSIS — J029 Acute pharyngitis, unspecified: Secondary | ICD-10-CM

## 2020-03-16 DIAGNOSIS — R5383 Other fatigue: Secondary | ICD-10-CM

## 2020-03-16 DIAGNOSIS — J3489 Other specified disorders of nose and nasal sinuses: Secondary | ICD-10-CM

## 2020-03-16 DIAGNOSIS — U071 COVID-19: Secondary | ICD-10-CM

## 2020-03-16 NOTE — Patient Instructions (Signed)

## 2020-03-16 NOTE — Progress Notes (Signed)
Virtual Visit via Video Note  I connected with Luis Hill on 03/16/20 at  9:30 AM EST by a video enabled telemedicine application and verified that I am speaking with the correct person using two identifiers.  Location: Patient: Home Provider: Office  Persons participating in this video call: Luis Reaper, NP and Talmadge Chad.   I discussed the limitations of evaluation and management by telemedicine and the availability of in person appointments. The patient expressed understanding and agreed to proceed.  History of Present Illness:  Patient reports fatigue, runny nose, nasal congestion, sore throat and cough.  He reports this started 4 days ago.  He is not blowing any mucus out of his nose.  He is having pain with swallowing but has not noticed any redness or white spots in the back of his throat.  His cough is mostly nonproductive.  He denies headache, ear pain, loss of taste or smell, chest pain or shortness of breath.  He denies fever, chills or body aches.  He has tried DayQuil, NyQuil, Tylenol and cough drops with some relief of symptoms.  He has not had any known COVID exposure.  He has had 2 COVID vaccines.  Past Medical History:  Diagnosis Date  . Knee pain     No current outpatient medications on file.   No current facility-administered medications for this visit.    No Known Allergies  Family History  Problem Relation Age of Onset  . Thyroid disease Mother   . Arthritis Mother   . Diabetes Brother        Type II  . Heart disease Brother   . Cancer Maternal Aunt        Lung (smoker)  . Cancer Maternal Uncle        Lung (smoker)  . Parkinsonism Paternal Uncle   . Stroke Paternal Grandmother   . Alcohol abuse Cousin   . Drug abuse Cousin   . Depression Neg Hx   . Colon cancer Neg Hx   . Prostate cancer Neg Hx     Social History   Socioeconomic History  . Marital status: Married    Spouse name: Not on file  . Number of children: 2  . Years of  education: Not on file  . Highest education level: Not on file  Occupational History  . Occupation: Owns Teacher, English as a foreign language  Tobacco Use  . Smoking status: Never Smoker  . Smokeless tobacco: Never Used  Substance and Sexual Activity  . Alcohol use: Yes    Alcohol/week: 3.0 - 4.0 standard drinks    Types: 3 - 4 Standard drinks or equivalent per week    Comment: occ  . Drug use: No  . Sexual activity: Yes    Partners: Female    Birth control/protection: None  Other Topics Concern  . Not on file  Social History Narrative   Runner, whole and half marathons   Enjoys basketball and going to the gym   Widow (wife died of cervical cancer Apr 05, 2022), 2 kids   Working at [employer], owns two agencies   [employer]   Social Determinants of Corporate investment banker Strain: Not on file  Food Insecurity: Not on file  Transportation Needs: Not on file  Physical Activity: Not on file  Stress: Not on file  Social Connections: Not on file  Intimate Partner Violence: Not on file     Constitutional: Pt reports fatigue. Denies fever, malaise, headache or abrupt weight changes.  HEENT: Pt reports runny  nose, nasal congestion. Denies eye pain, eye redness, ear pain, ringing in the ears, wax buildup, bloody nose, or sore throat. Respiratory: Pt reports cough. Denies difficulty breathing, shortness of breath, or sputum production.   Cardiovascular: Denies chest pain, chest tightness, palpitations or swelling in the hands or feet.  Gastrointestinal: Denies abdominal pain, bloating, constipation, diarrhea or blood in the stool.     No other specific complaints in a complete review of systems (except as listed in HPI above).  Observations/Objective:   Wt Readings from Last 3 Encounters:  02/14/20 196 lb (88.9 kg)  02/01/19 203 lb 4 oz (92.2 kg)  12/30/18 200 lb 6.4 oz (90.9 kg)    General: In NAD. HEENT:  Nose:slight congestion noted ; Throat/Mouth: hoarseness noted. Pulmonary/Chest: Normal  effort. No respiratory distress.  Neurological: Alert and oriented.   BMET    Component Value Date/Time   NA 141 02/11/2020 0914   K 4.3 02/11/2020 0914   CL 107 02/11/2020 0914   CO2 28 02/11/2020 0914   GLUCOSE 136 (H) 02/11/2020 0914   BUN 15 02/11/2020 0914   CREATININE 1.11 02/11/2020 0914   CALCIUM 9.0 02/11/2020 0914    Lipid Panel     Component Value Date/Time   CHOL 182 02/11/2020 0914   TRIG 78.0 02/11/2020 0914   HDL 50.80 02/11/2020 0914   CHOLHDL 4 02/11/2020 0914   VLDL 15.6 02/11/2020 0914   LDLCALC 116 (H) 02/11/2020 0914    CBC    Component Value Date/Time   WBC 6.3 12/02/2016 1027   RBC 5.20 12/02/2016 1027   HGB 17.0 12/02/2016 1027   HCT 50.4 12/02/2016 1027   PLT 206.0 12/02/2016 1027   MCV 96.9 12/02/2016 1027   MCHC 33.7 12/02/2016 1027   RDW 13.0 12/02/2016 1027   LYMPHSABS 2.1 05/06/2007 0032   MONOABS 0.6 05/06/2007 0032   EOSABS 0.1 05/06/2007 0032   BASOSABS 0.0 05/06/2007 0032    Hgb A1C Lab Results  Component Value Date   HGBA1C 5.4 02/11/2020        Assessment and Plan:  Fatigue, Runny Nose, Nasal Congestion, Sore Throat secondary to Covid 19:  Nasal Congestion, Ear Fullness, Scratchy Throat, Cough, SOB, Chest Pressure due to Covid 19:  Rx for Pred Taper for congestion and sore throat No indication for antibiotics at this time No indication for referral for antiviral therapy or monoclonal antibody infusion Encouraged rest and fluids  Return/ER precautions discussed Follow Up Instructions:    I discussed the assessment and treatment plan with the patient. The patient was provided an opportunity to ask questions and all were answered. The patient agreed with the plan and demonstrated an understanding of the instructions.   The patient was advised to call back or seek an in-person evaluation if the symptoms worsen or if the condition fails to improve as anticipated.     Luis Reaper, NP

## 2021-07-11 ENCOUNTER — Telehealth: Payer: Self-pay

## 2021-07-11 ENCOUNTER — Encounter: Payer: Self-pay | Admitting: Nurse Practitioner

## 2021-07-11 ENCOUNTER — Ambulatory Visit (INDEPENDENT_AMBULATORY_CARE_PROVIDER_SITE_OTHER): Payer: 59 | Admitting: Nurse Practitioner

## 2021-07-11 ENCOUNTER — Other Ambulatory Visit: Payer: Self-pay

## 2021-07-11 VITALS — BP 124/70 | HR 97 | Temp 97.5°F | Resp 12 | Ht 70.25 in | Wt 212.4 lb

## 2021-07-11 DIAGNOSIS — Z0000 Encounter for general adult medical examination without abnormal findings: Secondary | ICD-10-CM

## 2021-07-11 DIAGNOSIS — E6609 Other obesity due to excess calories: Secondary | ICD-10-CM

## 2021-07-11 DIAGNOSIS — Z23 Encounter for immunization: Secondary | ICD-10-CM | POA: Diagnosis not present

## 2021-07-11 DIAGNOSIS — Z1211 Encounter for screening for malignant neoplasm of colon: Secondary | ICD-10-CM

## 2021-07-11 DIAGNOSIS — Z6830 Body mass index (BMI) 30.0-30.9, adult: Secondary | ICD-10-CM

## 2021-07-11 LAB — COMPREHENSIVE METABOLIC PANEL
ALT: 28 U/L (ref 0–53)
AST: 16 U/L (ref 0–37)
Albumin: 4.4 g/dL (ref 3.5–5.2)
Alkaline Phosphatase: 56 U/L (ref 39–117)
BUN: 15 mg/dL (ref 6–23)
CO2: 31 mEq/L (ref 19–32)
Calcium: 9.5 mg/dL (ref 8.4–10.5)
Chloride: 105 mEq/L (ref 96–112)
Creatinine, Ser: 1.16 mg/dL (ref 0.40–1.50)
GFR: 76.17 mL/min (ref >60.00)
Glucose, Bld: 91 mg/dL (ref 70–99)
Potassium: 4 mEq/L (ref 3.5–5.1)
Sodium: 143 mEq/L (ref 135–145)
Total Bilirubin: 0.5 mg/dL (ref 0.2–1.2)
Total Protein: 6.8 g/dL (ref 6.0–8.3)

## 2021-07-11 LAB — LIPID PANEL
Cholesterol: 192 mg/dL (ref 0–200)
HDL: 37.6 mg/dL — ABNORMAL LOW (ref >39.00)
NonHDL: 154
Total CHOL/HDL Ratio: 5
Triglycerides: 262 mg/dL — ABNORMAL HIGH (ref 0.0–149.0)
VLDL: 52.4 mg/dL — ABNORMAL HIGH (ref 0.0–40.0)

## 2021-07-11 LAB — CBC
HCT: 44.5 % (ref 39.0–52.0)
Hemoglobin: 15.2 g/dL (ref 13.0–17.0)
MCHC: 34.1 g/dL (ref 30.0–36.0)
MCV: 92.8 fl (ref 78.0–100.0)
Platelets: 196 10*3/uL (ref 150.0–400.0)
RBC: 4.79 Mil/uL (ref 4.22–5.81)
RDW: 13.5 % (ref 11.5–15.5)
WBC: 5.7 10*3/uL (ref 4.0–10.5)

## 2021-07-11 LAB — HEMOGLOBIN A1C: Hgb A1c MFr Bld: 5.5 % (ref 4.6–6.5)

## 2021-07-11 LAB — LDL CHOLESTEROL, DIRECT: Direct LDL: 116 mg/dL

## 2021-07-11 LAB — TSH: TSH: 2.43 u[IU]/mL (ref 0.35–5.50)

## 2021-07-11 MED ORDER — NA SULFATE-K SULFATE-MG SULF 17.5-3.13-1.6 GM/177ML PO SOLN: 1.0000 | Freq: Once | ORAL | 0 refills | Status: AC

## 2021-07-11 NOTE — Assessment & Plan Note (Signed)
Td vaccine administered in office IM x1 ?

## 2021-07-11 NOTE — Progress Notes (Signed)
Gastroenterology Pre-Procedure Review ? ?Request Date: 09/21/2021 ?Requesting Physician: Dr. Allegra Lai ? ? ?PATIENT REVIEW QUESTIONS: The patient responded to the following health history questions as indicated:   ? ?1. Are you having any GI issues? no ?2. Do you have a personal history of Polyps? no ?3. Do you have a family history of Colon Cancer or Polyps? no ?4. Diabetes Mellitus? no ?5. Joint replacements in the past 12 months?no ?6. Major health problems in the past 3 months?no ?7. Any artificial heart valves, MVP, or defibrillator?no ?   ?MEDICATIONS & ALLERGIES:    ?Patient reports the following regarding taking any anticoagulation/antiplatelet therapy:   ?Plavix, Coumadin, Eliquis, Xarelto, Lovenox, Pradaxa, Brilinta, or Effient? no ?Aspirin? no ? ?Patient confirms/reports the following medications:  ?No current outpatient medications on file.  ? ?No current facility-administered medications for this visit.  ? ? ?Patient confirms/reports the following allergies:  ?No Known Allergies ? ?No orders of the defined types were placed in this encounter. ? ? ?AUTHORIZATION INFORMATION ?Primary Insurance: ?1D#: ?Group #: ? ?Secondary Insurance: ?1D#: ?Group #: ? ?SCHEDULE INFORMATION: ?Date: 09/21/2021 ?Time: ?Location: [facility] ?

## 2021-07-11 NOTE — Progress Notes (Signed)
? ?Established Patient Office Visit ? ?Subjective   ?Patient ID: Charise KillianMichael L Corso, male    DOB: [birth year]  Age: 46 y.o. MRN: [id number] ? ?Chief Complaint  ?Patient presents with  ? Transfer of Care  ? Annual Exam  ? ? ?HPI ?for complete physical and follow up of chronic conditions. ? ?Immunizations: ?-Tetanus: update today ?-Influenza:out of season. Has in the past ?-Covid-19:2 shots ?-Shingles: Too young ?-Pneumonia: Too young ? ? ? ?Diet: Fair diet. 3 meals a day with some snacking (fruit). Has one mtn dew a day and water ?Exercise: usually 3-4 weeks 45-1. Gym and some running states he ran 3 miles yesterday. ? ?Eye exam: Completes annually. End of 2021. [facility] [place] ?Dental exam: Completes semi-annually. [facility]  ? ? ?Colonoscopy: Ambulatory referral placed for [place]  ?Dexa: N/A ?PSA: Too young currently.  No prostate issues or cancer in family per patient report ? ?Lung Cancer Screening: Completed in   ? ?Sleep: 10-1030pm gets up 6-615. Feels rested. Not a snore ? ? ? ?Review of Systems  ?Constitutional:  Negative for chills, fever and malaise/fatigue.  ?Respiratory:  Negative for cough and shortness of breath.   ?Cardiovascular:  Negative for chest pain and leg swelling.  ?Gastrointestinal:  Negative for abdominal pain, constipation, diarrhea, nausea and vomiting.  ?     BM daily  ?Genitourinary:  Negative for dysuria and hematuria.  ?     Nocturia negative  ?Neurological:  Negative for dizziness, tingling, weakness and headaches.  ?Psychiatric/Behavioral:  Negative for hallucinations and suicidal ideas.   ? ?  ?Objective:  ?  ? ?BP 124/70   Pulse 97   Temp (!) 97.5 ?F (36.4 ?C)   Resp 12   Ht 5' 10.25" (1.784 m)   Wt 212 lb 6 oz (96.3 kg)   SpO2 96%   BMI 30.26 kg/m?  ?BP Readings from Last 3 Encounters:  ?07/11/21 124/70  ?02/14/20 120/82  ?02/01/19 128/80  ? ?Wt Readings from Last 3 Encounters:  ?07/11/21 212 lb 6 oz (96.3 kg)  ?02/14/20 196 lb (88.9 kg)  ?02/01/19 203 lb 4 oz  (92.2 kg)  ? ?  ? ?Physical Exam ?Vitals and nursing note reviewed. Exam conducted with a chaperone present [facility](Anastasiya ForsanHopkins, RMA).  ?Constitutional:   ?   Appearance: Normal appearance.  ?HENT:  ?   Right Ear: Tympanic membrane, ear canal and external ear normal.  ?   Left Ear: Tympanic membrane, ear canal and external ear normal.  ?   Mouth/Throat:  ?   Mouth: Mucous membranes are moist.  ?   Pharynx: Oropharynx is clear.  ?Eyes:  ?   Extraocular Movements: Extraocular movements intact.  ?   Pupils: Pupils are equal, round, and reactive to light.  ?Cardiovascular:  ?   Rate and Rhythm: Normal rate and regular rhythm.  ?   Pulses: Normal pulses.  ?   Heart sounds: Normal heart sounds.  ?Pulmonary:  ?   Effort: Pulmonary effort is normal.  ?   Breath sounds: Normal breath sounds.  ?Abdominal:  ?   General: Bowel sounds are normal. There is no distension.  ?   Palpations: There is no mass.  ?   Tenderness: There is no abdominal tenderness.  ?   Hernia: No hernia is present. There is no hernia in the left inguinal area or right inguinal area.  ?Genitourinary: ?   Penis: Normal and circumcised.   ?   Testes: Normal.  ?  Epididymis:  ?   Right: Normal.  ?   Left: Normal.  ?Musculoskeletal:  ?   Right lower leg: No edema.  ?   Left lower leg: No edema.  ?Lymphadenopathy:  ?   Cervical: No cervical adenopathy.  ?   Lower Body: No right inguinal adenopathy. No left inguinal adenopathy.  ?Skin: ?   General: Skin is warm.  ?Neurological:  ?   General: No focal deficit present.  ?   Mental Status: He is alert.  ?Psychiatric:     ?   Mood and Affect: Mood normal.     ?   Behavior: Behavior normal.     ?   Thought Content: Thought content normal.     ?   Judgment: Judgment normal.  ? ? ? ?No results found for any visits on 07/11/21. ? ? ? ?The 10-year ASCVD risk score (Arnett DK, et al., 2019) is: 1.6% ? ?  ?Assessment & Plan:  ? ?Problem List Items Addressed This Visit   ? ?  ? Other  ? Preventative health care - Primary   ?  Discussed age-appropriate immunizations and screening exams with patient.  Appropriate orders placed in office today.  Pending lab results ? ?  ?  ? Relevant Orders  ? CBC  ? Comprehensive metabolic panel  ? Lipid panel  ? TSH  ? Need for Td vaccine  ?  Td vaccine administered in office IM x1 ? ?  ?  ? Relevant Orders  ? Td : Tetanus/diphtheria >7yo Preservative  free (Completed)  ? Class 1 obesity due to excess calories without serious comorbidity with body mass index (BMI) of 30.0 to 30.9 in adult  ?  Patient continuing to work on healthy lifestyle modifications. ? ?  ?  ? Relevant Orders  ? Lipid panel  ? TSH  ? Hemoglobin A1c  ? ?Other Visit Diagnoses   ? ? Screening for colon cancer      ? Relevant Orders  ? Ambulatory referral to Gastroenterology  ? ?  ? ? ?Return in about 1 year (around 07/12/2022) for CPE and labs.  ? ? ?Romilda Garret, NP ? ?

## 2021-07-11 NOTE — Patient Instructions (Signed)
Nice to see you today ?I will be in touch with the lab results once I have them ?Follow up with me in 1 year for your next physical, sooner if you need me ?We did update your Tetanus vaccine today (TD) ?

## 2021-07-11 NOTE — Assessment & Plan Note (Signed)
Discussed age-appropriate immunizations and screening exams with patient.  Appropriate orders placed in office today.  Pending lab results ?

## 2021-07-11 NOTE — Assessment & Plan Note (Signed)
Patient continuing to work on healthy lifestyle modifications. ?

## 2021-07-11 NOTE — Telephone Encounter (Signed)
CALLED PATIENT NO ANSWER LEFT VOICEMAIL FOR A CALL BACK ? ?

## 2021-09-18 ENCOUNTER — Ambulatory Visit (INDEPENDENT_AMBULATORY_CARE_PROVIDER_SITE_OTHER): Payer: 59 | Admitting: Family

## 2021-09-18 ENCOUNTER — Telehealth: Payer: Self-pay | Admitting: Gastroenterology

## 2021-09-18 ENCOUNTER — Encounter: Payer: Self-pay | Admitting: Family

## 2021-09-18 VITALS — BP 120/78 | HR 77 | Temp 98.6°F | Resp 16 | Ht 70.25 in | Wt 213.0 lb

## 2021-09-18 DIAGNOSIS — L03115 Cellulitis of right lower limb: Secondary | ICD-10-CM | POA: Insufficient documentation

## 2021-09-18 DIAGNOSIS — T63461A Toxic effect of venom of wasps, accidental (unintentional), initial encounter: Secondary | ICD-10-CM | POA: Insufficient documentation

## 2021-09-18 DIAGNOSIS — R600 Localized edema: Secondary | ICD-10-CM | POA: Insufficient documentation

## 2021-09-18 MED ORDER — DOXYCYCLINE HYCLATE 100 MG PO TABS: 100.0000 mg | ORAL_TABLET | Freq: Two times a day (BID) | ORAL | 0 refills | Status: AC

## 2021-09-18 MED ORDER — PREDNISONE 20 MG PO TABS: ORAL_TABLET | ORAL | 0 refills | Status: AC

## 2021-09-18 NOTE — Patient Instructions (Signed)
Call dr. Verdis Prime office to reschedule colonoscopy.  [phone]  Due to recent changes in healthcare laws, you may see results of your imaging and/or laboratory studies on MyChart before I have had a chance to review them.  I understand that in some cases there may be results that are confusing or concerning to you. Please understand that not all results are received at the same time and often I may need to interpret multiple results in order to provide you with the best plan of care or course of treatment. Therefore, I ask that you please give me 2 business days to thoroughly review all your results before contacting my office for clarification. Should we see a critical lab result, you will be contacted sooner.   It was a pleasure seeing you today! Please do not hesitate to reach out with any questions and or concerns.  Regards,   Mort Sawyers FNP-C

## 2021-09-18 NOTE — Assessment & Plan Note (Signed)
Prednisone 20 mg taper take as directed Let me know if no improvement or worsening edema

## 2021-09-18 NOTE — Assessment & Plan Note (Signed)
rx doxycycline 100 mg po bid x 10 days Wound culture ordered today pending results Monitor for worsening s/s infection Keep covered with nonadherant bandage when out and about

## 2021-09-18 NOTE — Assessment & Plan Note (Signed)
Treating site for suspected MRSA infection/cellulitis  Benadryl tonight prn for itching

## 2021-09-18 NOTE — Telephone Encounter (Signed)
Patient wants to cancel colonoscopy for Friday 09/21/2021.

## 2021-09-18 NOTE — Progress Notes (Signed)
Established Patient Office Visit  Subjective:  Patient ID: Luis Hill, male    DOB: Apr 23, 1975  Age: 46 y.o. MRN: [id number]  CC:  Chief Complaint  Patient presents with   Blister   Insect Bite    Right ankle happened Saturday while mowing. Yellow jacket nest    HPI Luis Hill is here today with concerns.   Saturday was exposed to yellow jacket, was stung he thinks but only saw one yellow jacket. He has on right lower leg blisters swelling and some redness with some drainage. It is slightly itchy as well. It is not overly painful.   Past Medical History:  Diagnosis Date   Knee pain     Past Surgical History:  Procedure Laterality Date   Lasik both eyes  04/09/07   TONSILLECTOMY  Age 59   WISDOM TOOTH EXTRACTION      Family History  Problem Relation Age of Onset   Diabetes Mother    Thyroid disease Mother    Arthritis Mother    Dementia Mother    Heart disease Father 32       triple bypass surgery   Diabetes Brother        Type I   Heart disease Brother    Cancer Maternal Aunt        Lung (smoker)   Cancer Maternal Uncle        Lung (smoker)   Parkinsonism Paternal Uncle    Stroke Paternal Grandmother    Alcohol abuse Cousin    Drug abuse Cousin    Depression Neg Hx    Colon cancer Neg Hx    Prostate cancer Neg Hx     Social History   Socioeconomic History   Marital status: Married    Spouse name: Not on file   Number of children: 2   Years of education: Not on file   Highest education level: Bachelor's degree (e.g., BA, AB, BS)  Occupational History   Occupation: Owns Teacher, English as a foreign language  Tobacco Use   Smoking status: Never   Smokeless tobacco: Never  Substance and Sexual Activity   Alcohol use: Not Currently    Comment: once a week one beer   Drug use: No   Sexual activity: Yes    Partners: Female    Birth control/protection: None  Other Topics Concern   Not on file  Social History Narrative   Runner, whole and half marathons    Enjoys basketball and going to the gym   Widow (wife died of cervical cancer 2022/03/23), 2 kids   Independent agent, owns two agencies   [employer]   Social Determinants of Health   Financial Resource Strain: Not on file  Food Insecurity: Not on file  Transportation Needs: Not on file  Physical Activity: Not on file  Stress: Not on file  Social Connections: Not on file  Intimate Partner Violence: Not on file    No outpatient medications prior to visit.   No facility-administered medications prior to visit.    No Known Allergies      Objective:    Physical Exam Constitutional:      General: He is not in acute distress.    Appearance: Normal appearance. He is obese. He is not ill-appearing, toxic-appearing or diaphoretic.  Pulmonary:     Effort: Pulmonary effort is normal.  Musculoskeletal:     Right lower leg: Edema present.     Left lower leg: No edema.  Skin:    Findings:  Wound (Right lower extremity with medial scattered blisters with yellow to cloudy fluid within. One unroofed blister with red erythema, slight yellow discharge. Slight tenderness on palpation. Edema nonpitting right lower extremity surrounding puncture site.) present.  Neurological:     General: No focal deficit present.     Mental Status: He is alert and oriented to person, place, and time. Mental status is at baseline.         BP 120/78   Pulse 77   Temp 98.6 F (37 C)   Resp 16   Ht 5' 10.25" (1.784 m)   Wt 213 lb (96.6 kg)   SpO2 96%   BMI 30.35 kg/m  Wt Readings from Last 3 Encounters:  09/18/21 213 lb (96.6 kg)  07/11/21 212 lb 6 oz (96.3 kg)  02/14/20 196 lb (88.9 kg)     Health Maintenance Due  Topic Date Due   COVID-19 Vaccine (1) Never done   COLONOSCOPY (Pts 45-65yrs Insurance coverage will need to be confirmed)  Never done    There are no preventive care reminders to display for this patient.  Lab Results  Component Value Date   TSH 2.43 07/11/2021   Lab Results   Component Value Date   WBC 5.7 07/11/2021   HGB 15.2 07/11/2021   HCT 44.5 07/11/2021   MCV 92.8 07/11/2021   PLT 196.0 07/11/2021   Lab Results  Component Value Date   NA 143 07/11/2021   K 4.0 07/11/2021   CO2 31 07/11/2021   GLUCOSE 91 07/11/2021   BUN 15 07/11/2021   CREATININE 1.16 07/11/2021   BILITOT 0.5 07/11/2021   ALKPHOS 56 07/11/2021   AST 16 07/11/2021   ALT 28 07/11/2021   PROT 6.8 07/11/2021   ALBUMIN 4.4 07/11/2021   CALCIUM 9.5 07/11/2021   GFR 76.17 07/11/2021   Lab Results  Component Value Date   HGBA1C 5.5 07/11/2021      Assessment & Plan:   Problem List Items Addressed This Visit       Other   Cellulitis of right lower extremity    rx doxycycline 100 mg po bid x 10 days Wound culture ordered today pending results Monitor for worsening s/s infection Keep covered with nonadherant bandage when out and about      Relevant Medications   doxycycline (VIBRA-TABS) 100 MG tablet   predniSONE (DELTASONE) 20 MG tablet   Other Relevant Orders   WOUND CULTURE   Edema of right lower extremity    Prednisone 20 mg taper take as directed Let me know if no improvement or worsening edema      Relevant Medications   doxycycline (VIBRA-TABS) 100 MG tablet   predniSONE (DELTASONE) 20 MG tablet   Other Relevant Orders   WOUND CULTURE   Wasp sting - Primary    Treating site for suspected MRSA infection/cellulitis  Benadryl tonight prn for itching      Relevant Medications   doxycycline (VIBRA-TABS) 100 MG tablet   predniSONE (DELTASONE) 20 MG tablet   Other Relevant Orders   WOUND CULTURE    Meds ordered this encounter  Medications   doxycycline (VIBRA-TABS) 100 MG tablet    Sig: Take 1 tablet (100 mg total) by mouth 2 (two) times daily for 10 days.    Dispense:  20 tablet    Refill:  0    Order Specific Question:   Supervising Provider    Answer:   BEDSOLE, AMY E [[id number]]   predniSONE (DELTASONE) 20 MG  tablet    Sig: Take two tablets daily  for 3 days followed by one tablet daily for 4 days    Dispense:  10 tablet    Refill:  0    Order Specific Question:   Supervising Provider    Answer:   Ermalene Searing, AMY E [[id number]]    Follow-up: Return in about 1 week (around 09/25/2021) for follow up on wound .    Mort Sawyers, FNP

## 2021-09-19 NOTE — Telephone Encounter (Signed)
Patient does not want to rescheduled at this time. Called Endo cancel the procedure and talk vicki

## 2021-09-20 LAB — WOUND CULTURE
MICRO NUMBER:: 13691482
SPECIMEN QUALITY:: ADEQUATE

## 2021-09-20 NOTE — Progress Notes (Signed)
Pt currently being treated with doxycycline. Pending susceptibility report.

## 2021-09-21 ENCOUNTER — Ambulatory Visit
Admission: RE | Admit: 2021-09-21 | Payer: Commercial Managed Care - PPO | Source: Home / Self Care | Admitting: Gastroenterology

## 2021-09-21 ENCOUNTER — Encounter: Admission: RE | Payer: Self-pay | Source: Home / Self Care

## 2021-09-21 SURGERY — COLONOSCOPY WITH PROPOFOL
Anesthesia: General

## 2022-07-10 ENCOUNTER — Other Ambulatory Visit: Payer: Self-pay | Admitting: Family Medicine

## 2022-07-10 DIAGNOSIS — R1310 Dysphagia, unspecified: Secondary | ICD-10-CM

## 2022-07-26 ENCOUNTER — Ambulatory Visit
Admission: RE | Admit: 2022-07-26 | Discharge: 2022-07-26 | Disposition: A | Discharge status: Home / Self Care | Payer: BC Managed Care – PPO | Source: Ambulatory Visit | Attending: Family Medicine | Admitting: Family Medicine

## 2022-07-26 DIAGNOSIS — R1310 Dysphagia, unspecified: Secondary | ICD-10-CM | POA: Insufficient documentation

## 2022-07-26 NOTE — Procedures (Signed)
Modified Barium Swallow Study  Patient Details  Name: Luis Hill MRN: [id number] Date of Birth: Dec 19, 1975  Today's Date: 07/26/2022  Modified Barium Swallow completed.  Full report located under Chart Review in the Imaging Section.  History of Present Illness Pt is a 47 year old male who was referred by his PCP d/t pt report of choking when consuming rice and chicken. Per pt report today, pt had one episode of choking in which he thought he might "not make it."   Clinical Impression Pt presents with adeqaute oropharyngeal abilities. Penetration and aspiration note observed during study. Results of the study were conveyed to pt. Per chart, it appears that his PCP has consulted GI. Recommend follow up as indicated with GI for reported globus sensation. Factors that may increase risk of adverse event in presence of aspiration Luis Hill & Luis Hill 2021):  N/A  Swallow Evaluation Recommendations Recommendations: PO diet PO Diet Recommendation: Regular;Thin liquids (Level 0) Liquid Administration via: Cup;Straw Medication Administration: Whole meds with liquid Supervision: Patient able to self-feed Swallowing strategies  : Minimize environmental distractions;Slow rate;Small bites/sips Postural changes: Position pt fully upright for meals;Stay upright 30-60 min after meals Oral care recommendations: Oral care BID (2x/day) Recommended consults: Consider GI consultation     Luis Hill B. Luis Hill, M.S., CCC-SLP, Luis Hill Luis Hill Luis Hill  [name] Rehabilitation Services Office [phone] Ascom [phone] Fax [phone]

## 2023-01-13 ENCOUNTER — Ambulatory Visit: Payer: BC Managed Care – PPO

## 2023-01-13 DIAGNOSIS — K640 First degree hemorrhoids: Secondary | ICD-10-CM | POA: Diagnosis not present

## 2023-01-13 DIAGNOSIS — Z1211 Encounter for screening for malignant neoplasm of colon: Secondary | ICD-10-CM | POA: Diagnosis present

## 2023-01-13 DIAGNOSIS — K222 Esophageal obstruction: Secondary | ICD-10-CM | POA: Diagnosis not present

## 2023-01-13 DIAGNOSIS — K5730 Diverticulosis of large intestine without perforation or abscess without bleeding: Secondary | ICD-10-CM

## 2023-01-13 DIAGNOSIS — K200 Eosinophilic esophagitis: Secondary | ICD-10-CM
